# Patient Record
Sex: Male | Born: 1945
Health system: Southern US, Community
[De-identification: ages and names within clinical notes are randomized; demographics above are authoritative.]

## PROBLEM LIST (undated history)

## (undated) DIAGNOSIS — B1910 Unspecified viral hepatitis B without hepatic coma: Secondary | ICD-10-CM

## (undated) DIAGNOSIS — E7800 Pure hypercholesterolemia, unspecified: Secondary | ICD-10-CM

## (undated) DIAGNOSIS — F0390 Unspecified dementia without behavioral disturbance: Secondary | ICD-10-CM

## (undated) DIAGNOSIS — I10 Essential (primary) hypertension: Secondary | ICD-10-CM

## (undated) DIAGNOSIS — I639 Cerebral infarction, unspecified: Secondary | ICD-10-CM

---

## 2010-04-17 ENCOUNTER — Ambulatory Visit: Payer: Self-pay | Admitting: Diagnostic Radiology

## 2010-04-17 ENCOUNTER — Ambulatory Visit (HOSPITAL_BASED_OUTPATIENT_CLINIC_OR_DEPARTMENT_OTHER)
Admission: RE | Admit: 2010-04-17 | Discharge: 2010-04-17 | Payer: Self-pay | Source: Home / Self Care | Admitting: Internal Medicine

## 2016-07-10 DIAGNOSIS — I10 Essential (primary) hypertension: Secondary | ICD-10-CM | POA: Diagnosis not present

## 2016-07-10 DIAGNOSIS — I119 Hypertensive heart disease without heart failure: Secondary | ICD-10-CM | POA: Diagnosis not present

## 2016-07-10 DIAGNOSIS — E785 Hyperlipidemia, unspecified: Secondary | ICD-10-CM | POA: Diagnosis not present

## 2016-07-10 DIAGNOSIS — R7303 Prediabetes: Secondary | ICD-10-CM | POA: Diagnosis not present

## 2016-07-10 DIAGNOSIS — R062 Wheezing: Secondary | ICD-10-CM | POA: Diagnosis not present

## 2016-07-10 DIAGNOSIS — I6789 Other cerebrovascular disease: Secondary | ICD-10-CM | POA: Diagnosis not present

## 2016-07-10 DIAGNOSIS — J449 Chronic obstructive pulmonary disease, unspecified: Secondary | ICD-10-CM | POA: Diagnosis not present

## 2016-07-10 DIAGNOSIS — R739 Hyperglycemia, unspecified: Secondary | ICD-10-CM | POA: Diagnosis not present

## 2016-07-10 DIAGNOSIS — R319 Hematuria, unspecified: Secondary | ICD-10-CM | POA: Diagnosis not present

## 2016-07-12 DIAGNOSIS — E785 Hyperlipidemia, unspecified: Secondary | ICD-10-CM | POA: Diagnosis not present

## 2016-07-12 DIAGNOSIS — R062 Wheezing: Secondary | ICD-10-CM | POA: Diagnosis not present

## 2016-07-12 DIAGNOSIS — R7303 Prediabetes: Secondary | ICD-10-CM | POA: Diagnosis not present

## 2016-07-12 DIAGNOSIS — I10 Essential (primary) hypertension: Secondary | ICD-10-CM | POA: Diagnosis not present

## 2016-07-16 DIAGNOSIS — I6789 Other cerebrovascular disease: Secondary | ICD-10-CM | POA: Diagnosis not present

## 2016-07-16 DIAGNOSIS — Z0000 Encounter for general adult medical examination without abnormal findings: Secondary | ICD-10-CM | POA: Diagnosis not present

## 2016-07-16 DIAGNOSIS — R7303 Prediabetes: Secondary | ICD-10-CM | POA: Diagnosis not present

## 2016-07-16 DIAGNOSIS — I119 Hypertensive heart disease without heart failure: Secondary | ICD-10-CM | POA: Diagnosis not present

## 2016-07-16 DIAGNOSIS — Z136 Encounter for screening for cardiovascular disorders: Secondary | ICD-10-CM | POA: Diagnosis not present

## 2016-07-16 DIAGNOSIS — J449 Chronic obstructive pulmonary disease, unspecified: Secondary | ICD-10-CM | POA: Diagnosis not present

## 2016-07-16 DIAGNOSIS — E785 Hyperlipidemia, unspecified: Secondary | ICD-10-CM | POA: Diagnosis not present

## 2016-07-16 DIAGNOSIS — I10 Essential (primary) hypertension: Secondary | ICD-10-CM | POA: Diagnosis not present

## 2016-07-16 DIAGNOSIS — Z01118 Encounter for examination of ears and hearing with other abnormal findings: Secondary | ICD-10-CM | POA: Diagnosis not present

## 2016-07-16 DIAGNOSIS — Z131 Encounter for screening for diabetes mellitus: Secondary | ICD-10-CM | POA: Diagnosis not present

## 2016-08-10 DIAGNOSIS — J449 Chronic obstructive pulmonary disease, unspecified: Secondary | ICD-10-CM | POA: Diagnosis not present

## 2016-08-10 DIAGNOSIS — R739 Hyperglycemia, unspecified: Secondary | ICD-10-CM | POA: Diagnosis not present

## 2016-08-10 DIAGNOSIS — I119 Hypertensive heart disease without heart failure: Secondary | ICD-10-CM | POA: Diagnosis not present

## 2016-08-10 DIAGNOSIS — I10 Essential (primary) hypertension: Secondary | ICD-10-CM | POA: Diagnosis not present

## 2016-08-10 DIAGNOSIS — R7303 Prediabetes: Secondary | ICD-10-CM | POA: Diagnosis not present

## 2016-08-10 DIAGNOSIS — I6789 Other cerebrovascular disease: Secondary | ICD-10-CM | POA: Diagnosis not present

## 2016-08-10 DIAGNOSIS — R319 Hematuria, unspecified: Secondary | ICD-10-CM | POA: Diagnosis not present

## 2016-08-10 DIAGNOSIS — R062 Wheezing: Secondary | ICD-10-CM | POA: Diagnosis not present

## 2016-08-10 DIAGNOSIS — E785 Hyperlipidemia, unspecified: Secondary | ICD-10-CM | POA: Diagnosis not present

## 2016-08-13 DIAGNOSIS — I10 Essential (primary) hypertension: Secondary | ICD-10-CM | POA: Diagnosis not present

## 2016-08-13 DIAGNOSIS — J449 Chronic obstructive pulmonary disease, unspecified: Secondary | ICD-10-CM | POA: Diagnosis not present

## 2016-08-13 DIAGNOSIS — R69 Illness, unspecified: Secondary | ICD-10-CM | POA: Diagnosis not present

## 2016-08-13 DIAGNOSIS — I6789 Other cerebrovascular disease: Secondary | ICD-10-CM | POA: Diagnosis not present

## 2016-08-13 DIAGNOSIS — I119 Hypertensive heart disease without heart failure: Secondary | ICD-10-CM | POA: Diagnosis not present

## 2016-08-13 DIAGNOSIS — Z87891 Personal history of nicotine dependence: Secondary | ICD-10-CM | POA: Diagnosis not present

## 2016-08-13 DIAGNOSIS — R7303 Prediabetes: Secondary | ICD-10-CM | POA: Diagnosis not present

## 2016-08-13 DIAGNOSIS — E785 Hyperlipidemia, unspecified: Secondary | ICD-10-CM | POA: Diagnosis not present

## 2016-12-17 DIAGNOSIS — I119 Hypertensive heart disease without heart failure: Secondary | ICD-10-CM | POA: Diagnosis not present

## 2016-12-17 DIAGNOSIS — E785 Hyperlipidemia, unspecified: Secondary | ICD-10-CM | POA: Diagnosis not present

## 2016-12-17 DIAGNOSIS — Z87891 Personal history of nicotine dependence: Secondary | ICD-10-CM | POA: Diagnosis not present

## 2016-12-17 DIAGNOSIS — I10 Essential (primary) hypertension: Secondary | ICD-10-CM | POA: Diagnosis not present

## 2016-12-24 DIAGNOSIS — J449 Chronic obstructive pulmonary disease, unspecified: Secondary | ICD-10-CM | POA: Diagnosis not present

## 2016-12-24 DIAGNOSIS — I10 Essential (primary) hypertension: Secondary | ICD-10-CM | POA: Diagnosis not present

## 2016-12-24 DIAGNOSIS — E785 Hyperlipidemia, unspecified: Secondary | ICD-10-CM | POA: Diagnosis not present

## 2016-12-24 DIAGNOSIS — I6789 Other cerebrovascular disease: Secondary | ICD-10-CM | POA: Diagnosis not present

## 2016-12-24 DIAGNOSIS — R69 Illness, unspecified: Secondary | ICD-10-CM | POA: Diagnosis not present

## 2016-12-24 DIAGNOSIS — I119 Hypertensive heart disease without heart failure: Secondary | ICD-10-CM | POA: Diagnosis not present

## 2016-12-24 DIAGNOSIS — R7303 Prediabetes: Secondary | ICD-10-CM | POA: Diagnosis not present

## 2016-12-24 DIAGNOSIS — Z0000 Encounter for general adult medical examination without abnormal findings: Secondary | ICD-10-CM | POA: Diagnosis not present

## 2016-12-24 DIAGNOSIS — Z87891 Personal history of nicotine dependence: Secondary | ICD-10-CM | POA: Diagnosis not present

## 2017-08-30 DIAGNOSIS — R69 Illness, unspecified: Secondary | ICD-10-CM | POA: Diagnosis not present

## 2017-08-30 DIAGNOSIS — Z136 Encounter for screening for cardiovascular disorders: Secondary | ICD-10-CM | POA: Diagnosis not present

## 2017-08-30 DIAGNOSIS — E785 Hyperlipidemia, unspecified: Secondary | ICD-10-CM | POA: Diagnosis not present

## 2017-08-30 DIAGNOSIS — R7303 Prediabetes: Secondary | ICD-10-CM | POA: Diagnosis not present

## 2017-08-30 DIAGNOSIS — I10 Essential (primary) hypertension: Secondary | ICD-10-CM | POA: Diagnosis not present

## 2017-08-30 DIAGNOSIS — I119 Hypertensive heart disease without heart failure: Secondary | ICD-10-CM | POA: Diagnosis not present

## 2017-08-30 DIAGNOSIS — Z131 Encounter for screening for diabetes mellitus: Secondary | ICD-10-CM | POA: Diagnosis not present

## 2017-08-30 DIAGNOSIS — Z0000 Encounter for general adult medical examination without abnormal findings: Secondary | ICD-10-CM | POA: Diagnosis not present

## 2017-08-30 DIAGNOSIS — I6789 Other cerebrovascular disease: Secondary | ICD-10-CM | POA: Diagnosis not present

## 2017-08-30 DIAGNOSIS — J449 Chronic obstructive pulmonary disease, unspecified: Secondary | ICD-10-CM | POA: Diagnosis not present

## 2017-08-30 DIAGNOSIS — Z01118 Encounter for examination of ears and hearing with other abnormal findings: Secondary | ICD-10-CM | POA: Diagnosis not present

## 2017-08-30 DIAGNOSIS — Z87891 Personal history of nicotine dependence: Secondary | ICD-10-CM | POA: Diagnosis not present

## 2017-09-16 DIAGNOSIS — I10 Essential (primary) hypertension: Secondary | ICD-10-CM | POA: Diagnosis not present

## 2017-09-16 DIAGNOSIS — I6789 Other cerebrovascular disease: Secondary | ICD-10-CM | POA: Diagnosis not present

## 2017-09-16 DIAGNOSIS — E785 Hyperlipidemia, unspecified: Secondary | ICD-10-CM | POA: Diagnosis not present

## 2017-09-16 DIAGNOSIS — Z87891 Personal history of nicotine dependence: Secondary | ICD-10-CM | POA: Diagnosis not present

## 2017-09-16 DIAGNOSIS — I119 Hypertensive heart disease without heart failure: Secondary | ICD-10-CM | POA: Diagnosis not present

## 2017-09-16 DIAGNOSIS — R7303 Prediabetes: Secondary | ICD-10-CM | POA: Diagnosis not present

## 2017-09-16 DIAGNOSIS — J449 Chronic obstructive pulmonary disease, unspecified: Secondary | ICD-10-CM | POA: Diagnosis not present

## 2017-10-18 DIAGNOSIS — B181 Chronic viral hepatitis B without delta-agent: Secondary | ICD-10-CM | POA: Diagnosis not present

## 2017-10-22 DIAGNOSIS — N281 Cyst of kidney, acquired: Secondary | ICD-10-CM | POA: Diagnosis not present

## 2017-10-22 DIAGNOSIS — B181 Chronic viral hepatitis B without delta-agent: Secondary | ICD-10-CM | POA: Diagnosis not present

## 2017-11-06 DIAGNOSIS — M545 Low back pain: Secondary | ICD-10-CM | POA: Diagnosis not present

## 2017-11-18 DIAGNOSIS — J449 Chronic obstructive pulmonary disease, unspecified: Secondary | ICD-10-CM | POA: Diagnosis not present

## 2017-11-18 DIAGNOSIS — M545 Low back pain: Secondary | ICD-10-CM | POA: Diagnosis not present

## 2017-11-18 DIAGNOSIS — I6789 Other cerebrovascular disease: Secondary | ICD-10-CM | POA: Diagnosis not present

## 2017-11-18 DIAGNOSIS — R7303 Prediabetes: Secondary | ICD-10-CM | POA: Diagnosis not present

## 2017-11-18 DIAGNOSIS — I119 Hypertensive heart disease without heart failure: Secondary | ICD-10-CM | POA: Diagnosis not present

## 2017-11-18 DIAGNOSIS — E785 Hyperlipidemia, unspecified: Secondary | ICD-10-CM | POA: Diagnosis not present

## 2017-11-18 DIAGNOSIS — I10 Essential (primary) hypertension: Secondary | ICD-10-CM | POA: Diagnosis not present

## 2017-11-18 DIAGNOSIS — Z87891 Personal history of nicotine dependence: Secondary | ICD-10-CM | POA: Diagnosis not present

## 2017-12-05 DIAGNOSIS — B181 Chronic viral hepatitis B without delta-agent: Secondary | ICD-10-CM | POA: Diagnosis not present

## 2017-12-09 DIAGNOSIS — I6789 Other cerebrovascular disease: Secondary | ICD-10-CM | POA: Diagnosis not present

## 2017-12-09 DIAGNOSIS — M545 Low back pain: Secondary | ICD-10-CM | POA: Diagnosis not present

## 2017-12-09 DIAGNOSIS — I10 Essential (primary) hypertension: Secondary | ICD-10-CM | POA: Diagnosis not present

## 2017-12-09 DIAGNOSIS — R7303 Prediabetes: Secondary | ICD-10-CM | POA: Diagnosis not present

## 2017-12-09 DIAGNOSIS — Z87891 Personal history of nicotine dependence: Secondary | ICD-10-CM | POA: Diagnosis not present

## 2017-12-09 DIAGNOSIS — I119 Hypertensive heart disease without heart failure: Secondary | ICD-10-CM | POA: Diagnosis not present

## 2017-12-09 DIAGNOSIS — E785 Hyperlipidemia, unspecified: Secondary | ICD-10-CM | POA: Diagnosis not present

## 2017-12-09 DIAGNOSIS — J449 Chronic obstructive pulmonary disease, unspecified: Secondary | ICD-10-CM | POA: Diagnosis not present

## 2017-12-23 DIAGNOSIS — I10 Essential (primary) hypertension: Secondary | ICD-10-CM | POA: Diagnosis not present

## 2017-12-23 DIAGNOSIS — E785 Hyperlipidemia, unspecified: Secondary | ICD-10-CM | POA: Diagnosis not present

## 2017-12-23 DIAGNOSIS — Z0000 Encounter for general adult medical examination without abnormal findings: Secondary | ICD-10-CM | POA: Diagnosis not present

## 2017-12-23 DIAGNOSIS — R7303 Prediabetes: Secondary | ICD-10-CM | POA: Diagnosis not present

## 2017-12-23 DIAGNOSIS — I6789 Other cerebrovascular disease: Secondary | ICD-10-CM | POA: Diagnosis not present

## 2017-12-23 DIAGNOSIS — I119 Hypertensive heart disease without heart failure: Secondary | ICD-10-CM | POA: Diagnosis not present

## 2017-12-23 DIAGNOSIS — Z131 Encounter for screening for diabetes mellitus: Secondary | ICD-10-CM | POA: Diagnosis not present

## 2017-12-23 DIAGNOSIS — Z87891 Personal history of nicotine dependence: Secondary | ICD-10-CM | POA: Diagnosis not present

## 2017-12-23 DIAGNOSIS — J449 Chronic obstructive pulmonary disease, unspecified: Secondary | ICD-10-CM | POA: Diagnosis not present

## 2017-12-23 DIAGNOSIS — M545 Low back pain: Secondary | ICD-10-CM | POA: Diagnosis not present

## 2018-03-26 DIAGNOSIS — R69 Illness, unspecified: Secondary | ICD-10-CM | POA: Diagnosis not present

## 2018-04-09 DIAGNOSIS — H2513 Age-related nuclear cataract, bilateral: Secondary | ICD-10-CM | POA: Diagnosis not present

## 2018-04-09 DIAGNOSIS — H53462 Homonymous bilateral field defects, left side: Secondary | ICD-10-CM | POA: Diagnosis not present

## 2018-04-09 DIAGNOSIS — H5203 Hypermetropia, bilateral: Secondary | ICD-10-CM | POA: Diagnosis not present

## 2018-04-09 DIAGNOSIS — Z8673 Personal history of transient ischemic attack (TIA), and cerebral infarction without residual deficits: Secondary | ICD-10-CM | POA: Diagnosis not present

## 2018-04-09 DIAGNOSIS — B181 Chronic viral hepatitis B without delta-agent: Secondary | ICD-10-CM | POA: Diagnosis not present

## 2018-04-09 DIAGNOSIS — H35363 Drusen (degenerative) of macula, bilateral: Secondary | ICD-10-CM | POA: Diagnosis not present

## 2018-04-09 DIAGNOSIS — H25013 Cortical age-related cataract, bilateral: Secondary | ICD-10-CM | POA: Diagnosis not present

## 2018-05-19 DIAGNOSIS — B181 Chronic viral hepatitis B without delta-agent: Secondary | ICD-10-CM | POA: Diagnosis not present

## 2018-05-26 DIAGNOSIS — N281 Cyst of kidney, acquired: Secondary | ICD-10-CM | POA: Diagnosis not present

## 2018-05-26 DIAGNOSIS — N261 Atrophy of kidney (terminal): Secondary | ICD-10-CM | POA: Diagnosis not present

## 2018-06-17 DIAGNOSIS — N189 Chronic kidney disease, unspecified: Secondary | ICD-10-CM | POA: Diagnosis not present

## 2018-06-17 DIAGNOSIS — N281 Cyst of kidney, acquired: Secondary | ICD-10-CM | POA: Diagnosis not present

## 2018-06-17 DIAGNOSIS — R93421 Abnormal radiologic findings on diagnostic imaging of right kidney: Secondary | ICD-10-CM | POA: Diagnosis not present

## 2018-06-17 DIAGNOSIS — B181 Chronic viral hepatitis B without delta-agent: Secondary | ICD-10-CM | POA: Diagnosis not present

## 2018-06-17 DIAGNOSIS — R93422 Abnormal radiologic findings on diagnostic imaging of left kidney: Secondary | ICD-10-CM | POA: Diagnosis not present

## 2018-07-26 ENCOUNTER — Encounter (HOSPITAL_BASED_OUTPATIENT_CLINIC_OR_DEPARTMENT_OTHER): Payer: Self-pay | Admitting: Emergency Medicine

## 2018-07-26 ENCOUNTER — Emergency Department (HOSPITAL_BASED_OUTPATIENT_CLINIC_OR_DEPARTMENT_OTHER): Payer: Medicare HMO

## 2018-07-26 ENCOUNTER — Other Ambulatory Visit: Payer: Self-pay

## 2018-07-26 ENCOUNTER — Emergency Department (HOSPITAL_BASED_OUTPATIENT_CLINIC_OR_DEPARTMENT_OTHER)
Admission: EM | Admit: 2018-07-26 | Discharge: 2018-07-26 | Disposition: A | Discharge status: Home / Self Care | Payer: Medicare HMO | Attending: Emergency Medicine | Admitting: Emergency Medicine

## 2018-07-26 DIAGNOSIS — R5383 Other fatigue: Secondary | ICD-10-CM | POA: Diagnosis not present

## 2018-07-26 DIAGNOSIS — I10 Essential (primary) hypertension: Secondary | ICD-10-CM | POA: Diagnosis not present

## 2018-07-26 DIAGNOSIS — F0390 Unspecified dementia without behavioral disturbance: Secondary | ICD-10-CM | POA: Insufficient documentation

## 2018-07-26 DIAGNOSIS — R69 Illness, unspecified: Secondary | ICD-10-CM | POA: Diagnosis not present

## 2018-07-26 DIAGNOSIS — R531 Weakness: Secondary | ICD-10-CM | POA: Insufficient documentation

## 2018-07-26 DIAGNOSIS — Z7982 Long term (current) use of aspirin: Secondary | ICD-10-CM | POA: Insufficient documentation

## 2018-07-26 DIAGNOSIS — R51 Headache: Secondary | ICD-10-CM | POA: Diagnosis not present

## 2018-07-26 DIAGNOSIS — R42 Dizziness and giddiness: Secondary | ICD-10-CM | POA: Insufficient documentation

## 2018-07-26 DIAGNOSIS — R519 Headache, unspecified: Secondary | ICD-10-CM

## 2018-07-26 DIAGNOSIS — Z79899 Other long term (current) drug therapy: Secondary | ICD-10-CM | POA: Insufficient documentation

## 2018-07-26 DIAGNOSIS — S0003XA Contusion of scalp, initial encounter: Secondary | ICD-10-CM | POA: Diagnosis not present

## 2018-07-26 HISTORY — DX: Pure hypercholesterolemia, unspecified: E78.00

## 2018-07-26 HISTORY — DX: Essential (primary) hypertension: I10

## 2018-07-26 HISTORY — DX: Cerebral infarction, unspecified: I63.9

## 2018-07-26 HISTORY — DX: Unspecified viral hepatitis B without hepatic coma: B19.10

## 2018-07-26 LAB — CBC WITH DIFFERENTIAL/PLATELET
ABS IMMATURE GRANULOCYTES: 0.03 10*3/uL (ref 0.00–0.07)
Basophils Absolute: 0 10*3/uL (ref 0.0–0.1)
Basophils Relative: 1 %
Eosinophils Absolute: 0.2 10*3/uL (ref 0.0–0.5)
Eosinophils Relative: 3 %
HEMATOCRIT: 46.6 % (ref 39.0–52.0)
HEMOGLOBIN: 15.7 g/dL (ref 13.0–17.0)
Immature Granulocytes: 1 %
LYMPHS ABS: 1.3 10*3/uL (ref 0.7–4.0)
Lymphocytes Relative: 20 %
MCH: 29.7 pg (ref 26.0–34.0)
MCHC: 33.7 g/dL (ref 30.0–36.0)
MCV: 88.1 fL (ref 80.0–100.0)
MONO ABS: 0.5 10*3/uL (ref 0.1–1.0)
MONOS PCT: 8 %
NEUTROS ABS: 4.5 10*3/uL (ref 1.7–7.7)
Neutrophils Relative %: 67 %
Platelets: 208 10*3/uL (ref 150–400)
RBC: 5.29 MIL/uL (ref 4.22–5.81)
RDW: 12.3 % (ref 11.5–15.5)
WBC: 6.5 10*3/uL (ref 4.0–10.5)
nRBC: 0 % (ref 0.0–0.2)

## 2018-07-26 LAB — URINALYSIS, MICROSCOPIC (REFLEX)

## 2018-07-26 LAB — ETHANOL

## 2018-07-26 LAB — COMPREHENSIVE METABOLIC PANEL
ALK PHOS: 66 U/L (ref 38–126)
ALT: 25 U/L (ref 0–44)
AST: 26 U/L (ref 15–41)
Albumin: 4.3 g/dL (ref 3.5–5.0)
Anion gap: 9 (ref 5–15)
BUN: 13 mg/dL (ref 8–23)
CO2: 27 mmol/L (ref 22–32)
Calcium: 9.1 mg/dL (ref 8.9–10.3)
Chloride: 101 mmol/L (ref 98–111)
Creatinine, Ser: 0.92 mg/dL (ref 0.61–1.24)
GFR calc Af Amer: >60 mL/min (ref >60)
Glucose, Bld: 113 mg/dL — ABNORMAL HIGH (ref 70–99)
Potassium: 3.8 mmol/L (ref 3.5–5.1)
Sodium: 137 mmol/L (ref 135–145)
Total Bilirubin: 0.9 mg/dL (ref 0.3–1.2)
Total Protein: 7.8 g/dL (ref 6.5–8.1)

## 2018-07-26 LAB — URINALYSIS, ROUTINE W REFLEX MICROSCOPIC
Bilirubin Urine: NEGATIVE
GLUCOSE, UA: NEGATIVE mg/dL
Ketones, ur: NEGATIVE mg/dL
Leukocytes, UA: NEGATIVE
Nitrite: NEGATIVE
PH: 7.5 (ref 5.0–8.0)
Protein, ur: NEGATIVE mg/dL
Specific Gravity, Urine: 1.01 (ref 1.005–1.030)

## 2018-07-26 LAB — RAPID URINE DRUG SCREEN, HOSP PERFORMED
Amphetamines: NOT DETECTED
BENZODIAZEPINES: NOT DETECTED
Barbiturates: NOT DETECTED
COCAINE: NOT DETECTED
OPIATES: NOT DETECTED
TETRAHYDROCANNABINOL: NOT DETECTED

## 2018-07-26 LAB — PROTIME-INR
INR: 0.9
Prothrombin Time: 12.1 seconds (ref 11.4–15.2)

## 2018-07-26 LAB — APTT: aPTT: 26 seconds (ref 24–36)

## 2018-07-26 LAB — CBG MONITORING, ED: Glucose-Capillary: 117 mg/dL — ABNORMAL HIGH (ref 70–99)

## 2018-07-26 LAB — TROPONIN I: Troponin I: <0.03 ng/mL (ref <0.03)

## 2018-07-26 MED ORDER — ACETAMINOPHEN 325 MG PO TABS
650.0000 mg | ORAL_TABLET | Freq: Once | ORAL | Status: AC
  Administered 2018-07-26: 650 mg via ORAL
  Filled 2018-07-26: qty 2

## 2018-07-26 MED ORDER — SODIUM CHLORIDE 0.9 % IV BOLUS
500.0000 mL | Freq: Once | INTRAVENOUS | Status: AC
  Administered 2018-07-26: 500 mL via INTRAVENOUS

## 2018-07-26 NOTE — Discharge Instructions (Addendum)
If you develop continued, recurrent, or worsening headache, fever, neck stiffness, vomiting, blurry or double vision, weakness or numbness in your arms or legs, trouble speaking, or any other new/concerning symptoms then return to the ER for evaluation.  

## 2018-07-26 NOTE — ED Provider Notes (Signed)
[facility] EMERGENCY DEPARTMENT Provider Note   CSN: [id number] Arrival date & time: 07/26/18  9233     History   Chief Complaint Chief Complaint  Patient presents with  . Dizziness  . Headache    HPI Collier Kirchberg is a 73 y.o. male.  HPI  72 year old male presents with headache, dizziness, and fatigue.  History is taken through the daughter who interprets.  The patient developed a headache sometime around 7 PM last night.  The headache was probably around a 7 at that time and is now about a 5.  It has been continuous since.  Feels like there is a constant pain at the top of his scalp and he also has generalized weakness since.  He feels dizzy like some things are spinning.  No blurry vision or double vision.  No neck pain or stiffness, chest pain, shortness of breath, cough, fever, vomiting.  No unilateral weakness.  Has not taken anything for the headache. Family states he has a history of memory issues/dementia but is at his baseline/not confused currently. No difficulty with ambulating at home.  Past Medical History:  Diagnosis Date  . Hepatitis B   . High cholesterol   . Hypertension   . Stroke [facility])     There are no active problems to display for this patient.   History reviewed. No pertinent surgical history.      Home Medications    Prior to Admission medications   Medication Sig Start Date End Date Taking? Authorizing Provider  aspirin EC 81 MG tablet Take 81 mg by mouth daily.   Yes [provider]  donepezil (ARICEPT) 5 MG tablet Take 5 mg by mouth at bedtime.   Yes [provider]  olmesartan-hydrochlorothiazide (BENICAR HCT) 20-12.5 MG tablet Take 1 tablet by mouth daily.   Yes [provider]  simvastatin (ZOCOR) 20 MG tablet Take 20 mg by mouth daily.   Yes [provider]    Family History No family history on file.  Social History Social History   Tobacco Use  . Smoking status: Never Smoker  .  Smokeless tobacco: Never Used  Substance Use Topics  . Alcohol use: Not Currently  . Drug use: Not on file     Allergies   Patient has no known allergies.   Review of Systems Review of Systems  Constitutional: Negative for fever.  Eyes: Negative for visual disturbance.  Respiratory: Negative for shortness of breath.   Cardiovascular: Negative for chest pain.  Gastrointestinal: Negative for vomiting.  Musculoskeletal: Negative for neck pain.  Neurological: Positive for dizziness, weakness (generalized) and headaches. Negative for speech difficulty.  All other systems reviewed and are negative.    Physical Exam Updated Vital Signs BP (!) 141/94   Pulse 70   Temp 98.1 F (36.7 C) (Oral)   Resp 18   Ht 5\' 3"  (1.6 m)   Wt 58.1 kg   SpO2 100%   BMI 22.67 kg/m   Physical Exam Vitals signs and nursing note reviewed.  Constitutional:      Appearance: He is well-developed.  HENT:     Head: Normocephalic and atraumatic.     Comments: No scalp tenderness    Right Ear: External ear normal.     Left Ear: External ear normal.     Nose: Nose normal.  Eyes:     General:        Right eye: No discharge.        Left eye:  No discharge.     Extraocular Movements: Extraocular movements intact.     Right eye: No nystagmus.     Left eye: No nystagmus.     Pupils: Pupils are equal, round, and reactive to light.  Neck:     Musculoskeletal: Normal range of motion and neck supple. No neck rigidity.  Cardiovascular:     Rate and Rhythm: Normal rate and regular rhythm.     Heart sounds: Normal heart sounds.  Pulmonary:     Effort: Pulmonary effort is normal.     Breath sounds: Normal breath sounds.  Abdominal:     Palpations: Abdomen is soft.     Tenderness: There is no abdominal tenderness.  Skin:    General: Skin is warm and dry.  Neurological:     Mental Status: He is alert.     Comments: CN 3-12 grossly intact. 5/5 strength in all 4 extremities. Grossly normal sensation.  Normal finger to nose. Normal gait.  Psychiatric:        Mood and Affect: Mood is not anxious.      ED Treatments / Results  Labs (all labs ordered are listed, but only abnormal results are displayed) Labs Reviewed  URINALYSIS, ROUTINE W REFLEX MICROSCOPIC - Abnormal; Notable for the following components:      Result Value   Hgb urine dipstick TRACE (*)    All other components within normal limits  COMPREHENSIVE METABOLIC PANEL - Abnormal; Notable for the following components:   Glucose, Bld 113 (*)    All other components within normal limits  URINALYSIS, MICROSCOPIC (REFLEX) - Abnormal; Notable for the following components:   Bacteria, UA RARE (*)    All other components within normal limits  CBG MONITORING, ED - Abnormal; Notable for the following components:   Glucose-Capillary 117 (*)    All other components within normal limits  ETHANOL  TROPONIN I  RAPID URINE DRUG SCREEN, HOSP PERFORMED  CBC WITH DIFFERENTIAL/PLATELET  PROTIME-INR  APTT    EKG EKG Interpretation  Date/Time:  Saturday July 26 2018 08:48:58 EST Ventricular Rate:  80 PR Interval:    QRS Duration: 97 QT Interval:  393 QTC Calculation: 454 R Axis:   -38 Text Interpretation:  Normal sinus rhythm Prolonged PR interval Left axis deviation Anterior infarct, old Artifact No old tracing to compare Confirmed by Pricilla Loveless [id number]) on 07/26/2018 8:52:25 AM   Radiology Ct Head Wo Contrast  Result Date: 07/26/2018 CLINICAL DATA:  Dizziness.  Ataxia. EXAM: CT HEAD WITHOUT CONTRAST TECHNIQUE: Contiguous axial images were obtained from the base of the skull through the vertex without intravenous contrast. COMPARISON:  January 04, 2015 FINDINGS: Brain: There is moderate diffuse atrophy. There is no intracranial mass, hemorrhage, extra-axial fluid collection, or midline shift. There is evidence of a prior infarct in the medial right occipital lobe. There is evidence of a prior infarct in the posterior left  parietal lobe. There is patchy small vessel disease in the centra semiovale bilaterally. There is evidence of a prior infarct involving a portion of the head of the caudate nucleus on the left. There is small vessel disease in the anterior limb of each internal and external capsule. No acute infarct is evident. Vascular: No hyperdense vessel. There is calcification in each carotid siphon region. Skull: The bony calvarium appears intact. There is a small left parietal scalp hematoma. Sinuses/Orbits: There is mucosal thickening in several ethmoid air cells. There is slight mucosal thickening in the medial right maxillary antrum. Other paranasal  sinuses which are visualized are clear. Orbits appear symmetric bilaterally. Other: Mastoid air cells are clear. IMPRESSION: Stable atrophy with prior infarcts as noted. No acute infarct evident. No mass or hemorrhage. There are foci of arterial vascular calcification. There is a small left parietal scalp hematoma without underlying fracture. There are areas of paranasal sinus disease. Electronically Signed   By: Bretta BangWilliam  Woodruff III M.D.   On: 07/26/2018 09:52   Mr Brain Wo Contrast  Result Date: 07/26/2018 CLINICAL DATA:  Dizziness and weakness with lethargy EXAM: MRI HEAD WITHOUT CONTRAST TECHNIQUE: Multiplanar, multiecho pulse sequences of the brain and surrounding structures were obtained without intravenous contrast. COMPARISON:  06/25/2009 FINDINGS: Brain: Advanced brain atrophy with asymmetric medial temporal volume loss on the right. Moderate remote right occipital and left parietal infarcts. Small vessel infarcts in the thin bilateral corona radiata. No acute infarct, acute hemorrhage, obstructive hydrocephalus, or collection. Vascular: Major flow voids are preserved Skull and upper cervical spine: Negative for marrow lesion. Chronic small appearance of the dens which may also be eroded. No associated pannus. Sinuses/Orbits: Negative Other: Intermittently motion  degraded. IMPRESSION: 1. No acute finding. 2. Advanced atrophy. 3. Moderate remote right occipital and left parietal infarcts. Small vessel ischemia in the deep cerebral white matter. Electronically Signed   By: Marnee SpringJonathon  Watts M.D.   On: 07/26/2018 12:24    Procedures Procedures (including critical care time) Angiocath insertion Performed by: Audree CamelScott T Esco Joslyn  Consent: Verbal consent obtained. Risks and benefits: risks, benefits and alternatives were discussed Time out: Immediately prior to procedure a "time out" was called to verify the correct patient, procedure, equipment, support staff and site/side marked as required.  Preparation: Patient was prepped and draped in the usual sterile fashion.  Vein Location: right basilic  Ultrasound Guided  Gauge: 20  Normal blood return and flush without difficulty Patient tolerance: Patient tolerated the procedure well with no immediate complications.     Medications Ordered in ED Medications  sodium chloride 0.9 % bolus 500 mL ( Intravenous Stopped 07/26/18 1117)  acetaminophen (TYLENOL) tablet 650 mg (650 mg Oral Given 07/26/18 [id number])     Initial Impression / Assessment and Plan / ED Course  I have reviewed the triage vital signs and the nursing notes.  Pertinent labs & imaging results that were available during my care of the patient were reviewed by me and considered in my medical decision making (see chart for details).     Patient presents with moderate headache and room spinning.  CT and lab work is overall reassuring.  Given age and prior strokes, MRI obtained to help rule out acute cerebellar infarct.  This is negative for acute pathology.  He is ambulating without difficulty and reports his symptoms have significantly improved and are essentially gone.  I doubt subarachnoid hemorrhage in this presentation given no severe/thunderclap headache and no other associated signs/symptoms such as vomiting or neck pain/stiffness.  Doubt  acute CNS emergency otherwise such as encephalitis or meningitis.  Will discharge home to follow-up with PCP.  Discussed return precautions.  Final Clinical Impressions(s) / ED Diagnoses   Final diagnoses:  Acute nonintractable headache, unspecified headache type  Vertigo    ED Discharge Orders    None       Pricilla LovelessGoldston, Justis Closser, MD 07/26/18 1438

## 2018-07-26 NOTE — ED Notes (Signed)
ED Provider at bedside. 

## 2018-07-26 NOTE — ED Notes (Signed)
Patient transported to MRI 

## 2018-07-26 NOTE — ED Notes (Signed)
IV attempted by RT without success.

## 2018-07-26 NOTE — ED Notes (Addendum)
Pt transported to CT ?

## 2018-07-26 NOTE — ED Notes (Signed)
IV attempted x2 without success.

## 2018-07-26 NOTE — ED Triage Notes (Signed)
Pt speaks vietnamese. Pt daughter is at bedside and speaks english. Pt c/o headache to the top of his head, dizziness and generalized weakness since 7pm yesterday. Denies weakness to arms/legs, speech clear.

## 2018-09-01 DIAGNOSIS — Z131 Encounter for screening for diabetes mellitus: Secondary | ICD-10-CM | POA: Diagnosis not present

## 2018-09-01 DIAGNOSIS — Z0001 Encounter for general adult medical examination with abnormal findings: Secondary | ICD-10-CM | POA: Diagnosis not present

## 2018-09-01 DIAGNOSIS — J449 Chronic obstructive pulmonary disease, unspecified: Secondary | ICD-10-CM | POA: Diagnosis not present

## 2018-09-01 DIAGNOSIS — I6789 Other cerebrovascular disease: Secondary | ICD-10-CM | POA: Diagnosis not present

## 2018-09-01 DIAGNOSIS — R7303 Prediabetes: Secondary | ICD-10-CM | POA: Diagnosis not present

## 2018-09-01 DIAGNOSIS — B488 Other specified mycoses: Secondary | ICD-10-CM | POA: Diagnosis not present

## 2018-09-01 DIAGNOSIS — Z87891 Personal history of nicotine dependence: Secondary | ICD-10-CM | POA: Diagnosis not present

## 2018-09-01 DIAGNOSIS — I119 Hypertensive heart disease without heart failure: Secondary | ICD-10-CM | POA: Diagnosis not present

## 2018-09-01 DIAGNOSIS — Z136 Encounter for screening for cardiovascular disorders: Secondary | ICD-10-CM | POA: Diagnosis not present

## 2018-09-01 DIAGNOSIS — I10 Essential (primary) hypertension: Secondary | ICD-10-CM | POA: Diagnosis not present

## 2018-09-01 DIAGNOSIS — Z01118 Encounter for examination of ears and hearing with other abnormal findings: Secondary | ICD-10-CM | POA: Diagnosis not present

## 2018-09-23 DIAGNOSIS — I119 Hypertensive heart disease without heart failure: Secondary | ICD-10-CM | POA: Diagnosis not present

## 2018-09-23 DIAGNOSIS — I6789 Other cerebrovascular disease: Secondary | ICD-10-CM | POA: Diagnosis not present

## 2018-09-23 DIAGNOSIS — R7303 Prediabetes: Secondary | ICD-10-CM | POA: Diagnosis not present

## 2018-09-23 DIAGNOSIS — E782 Mixed hyperlipidemia: Secondary | ICD-10-CM | POA: Diagnosis not present

## 2018-09-23 DIAGNOSIS — Z87891 Personal history of nicotine dependence: Secondary | ICD-10-CM | POA: Diagnosis not present

## 2018-09-23 DIAGNOSIS — J449 Chronic obstructive pulmonary disease, unspecified: Secondary | ICD-10-CM | POA: Diagnosis not present

## 2018-09-23 DIAGNOSIS — I10 Essential (primary) hypertension: Secondary | ICD-10-CM | POA: Diagnosis not present

## 2018-11-24 DIAGNOSIS — B181 Chronic viral hepatitis B without delta-agent: Secondary | ICD-10-CM | POA: Diagnosis not present

## 2018-12-26 DIAGNOSIS — R7303 Prediabetes: Secondary | ICD-10-CM | POA: Diagnosis not present

## 2018-12-26 DIAGNOSIS — J449 Chronic obstructive pulmonary disease, unspecified: Secondary | ICD-10-CM | POA: Diagnosis not present

## 2018-12-26 DIAGNOSIS — E782 Mixed hyperlipidemia: Secondary | ICD-10-CM | POA: Diagnosis not present

## 2018-12-26 DIAGNOSIS — I6789 Other cerebrovascular disease: Secondary | ICD-10-CM | POA: Diagnosis not present

## 2018-12-26 DIAGNOSIS — I119 Hypertensive heart disease without heart failure: Secondary | ICD-10-CM | POA: Diagnosis not present

## 2018-12-26 DIAGNOSIS — Z0001 Encounter for general adult medical examination with abnormal findings: Secondary | ICD-10-CM | POA: Diagnosis not present

## 2018-12-26 DIAGNOSIS — I10 Essential (primary) hypertension: Secondary | ICD-10-CM | POA: Diagnosis not present

## 2018-12-26 DIAGNOSIS — Z87891 Personal history of nicotine dependence: Secondary | ICD-10-CM | POA: Diagnosis not present

## 2019-01-07 DIAGNOSIS — B181 Chronic viral hepatitis B without delta-agent: Secondary | ICD-10-CM | POA: Diagnosis not present

## 2019-04-13 DIAGNOSIS — R69 Illness, unspecified: Secondary | ICD-10-CM | POA: Diagnosis not present

## 2019-06-01 DIAGNOSIS — R69 Illness, unspecified: Secondary | ICD-10-CM | POA: Diagnosis not present

## 2019-06-15 DIAGNOSIS — R69 Illness, unspecified: Secondary | ICD-10-CM | POA: Diagnosis not present

## 2019-06-24 DIAGNOSIS — K7689 Other specified diseases of liver: Secondary | ICD-10-CM | POA: Diagnosis not present

## 2019-06-24 DIAGNOSIS — R69 Illness, unspecified: Secondary | ICD-10-CM | POA: Diagnosis not present

## 2019-07-13 DIAGNOSIS — Z0001 Encounter for general adult medical examination with abnormal findings: Secondary | ICD-10-CM | POA: Diagnosis not present

## 2019-07-13 DIAGNOSIS — Z01118 Encounter for examination of ears and hearing with other abnormal findings: Secondary | ICD-10-CM | POA: Diagnosis not present

## 2019-07-13 DIAGNOSIS — I119 Hypertensive heart disease without heart failure: Secondary | ICD-10-CM | POA: Diagnosis not present

## 2019-07-13 DIAGNOSIS — Z1329 Encounter for screening for other suspected endocrine disorder: Secondary | ICD-10-CM | POA: Diagnosis not present

## 2019-07-13 DIAGNOSIS — Z131 Encounter for screening for diabetes mellitus: Secondary | ICD-10-CM | POA: Diagnosis not present

## 2019-07-13 DIAGNOSIS — E782 Mixed hyperlipidemia: Secondary | ICD-10-CM | POA: Diagnosis not present

## 2019-07-13 DIAGNOSIS — J449 Chronic obstructive pulmonary disease, unspecified: Secondary | ICD-10-CM | POA: Diagnosis not present

## 2019-07-13 DIAGNOSIS — Z789 Other specified health status: Secondary | ICD-10-CM | POA: Diagnosis not present

## 2019-07-13 DIAGNOSIS — I10 Essential (primary) hypertension: Secondary | ICD-10-CM | POA: Diagnosis not present

## 2019-07-13 DIAGNOSIS — R7303 Prediabetes: Secondary | ICD-10-CM | POA: Diagnosis not present

## 2019-07-13 DIAGNOSIS — I6789 Other cerebrovascular disease: Secondary | ICD-10-CM | POA: Diagnosis not present

## 2019-08-21 DIAGNOSIS — Z008 Encounter for other general examination: Secondary | ICD-10-CM | POA: Diagnosis not present

## 2019-08-21 DIAGNOSIS — E785 Hyperlipidemia, unspecified: Secondary | ICD-10-CM | POA: Diagnosis not present

## 2019-08-21 DIAGNOSIS — N529 Male erectile dysfunction, unspecified: Secondary | ICD-10-CM | POA: Diagnosis not present

## 2019-08-21 DIAGNOSIS — Z87891 Personal history of nicotine dependence: Secondary | ICD-10-CM | POA: Diagnosis not present

## 2019-08-21 DIAGNOSIS — R69 Illness, unspecified: Secondary | ICD-10-CM | POA: Diagnosis not present

## 2019-08-21 DIAGNOSIS — I10 Essential (primary) hypertension: Secondary | ICD-10-CM | POA: Diagnosis not present

## 2019-08-21 DIAGNOSIS — Z8673 Personal history of transient ischemic attack (TIA), and cerebral infarction without residual deficits: Secondary | ICD-10-CM | POA: Diagnosis not present

## 2019-08-24 DIAGNOSIS — Z87891 Personal history of nicotine dependence: Secondary | ICD-10-CM | POA: Diagnosis not present

## 2019-08-24 DIAGNOSIS — R7303 Prediabetes: Secondary | ICD-10-CM | POA: Diagnosis not present

## 2019-08-24 DIAGNOSIS — J449 Chronic obstructive pulmonary disease, unspecified: Secondary | ICD-10-CM | POA: Diagnosis not present

## 2019-08-24 DIAGNOSIS — I10 Essential (primary) hypertension: Secondary | ICD-10-CM | POA: Diagnosis not present

## 2019-08-24 DIAGNOSIS — I119 Hypertensive heart disease without heart failure: Secondary | ICD-10-CM | POA: Diagnosis not present

## 2019-08-24 DIAGNOSIS — E782 Mixed hyperlipidemia: Secondary | ICD-10-CM | POA: Diagnosis not present

## 2019-08-24 DIAGNOSIS — I6789 Other cerebrovascular disease: Secondary | ICD-10-CM | POA: Diagnosis not present

## 2019-08-24 DIAGNOSIS — R05 Cough: Secondary | ICD-10-CM | POA: Diagnosis not present

## 2019-10-19 DIAGNOSIS — E782 Mixed hyperlipidemia: Secondary | ICD-10-CM | POA: Diagnosis not present

## 2019-10-19 DIAGNOSIS — I119 Hypertensive heart disease without heart failure: Secondary | ICD-10-CM | POA: Diagnosis not present

## 2019-10-19 DIAGNOSIS — Z87891 Personal history of nicotine dependence: Secondary | ICD-10-CM | POA: Diagnosis not present

## 2019-10-19 DIAGNOSIS — I10 Essential (primary) hypertension: Secondary | ICD-10-CM | POA: Diagnosis not present

## 2019-10-19 DIAGNOSIS — R7303 Prediabetes: Secondary | ICD-10-CM | POA: Diagnosis not present

## 2019-10-19 DIAGNOSIS — I6789 Other cerebrovascular disease: Secondary | ICD-10-CM | POA: Diagnosis not present

## 2019-10-19 DIAGNOSIS — J449 Chronic obstructive pulmonary disease, unspecified: Secondary | ICD-10-CM | POA: Diagnosis not present

## 2019-12-14 DIAGNOSIS — R69 Illness, unspecified: Secondary | ICD-10-CM | POA: Diagnosis not present

## 2020-01-25 DIAGNOSIS — Z87891 Personal history of nicotine dependence: Secondary | ICD-10-CM | POA: Diagnosis not present

## 2020-01-25 DIAGNOSIS — R7303 Prediabetes: Secondary | ICD-10-CM | POA: Diagnosis not present

## 2020-01-25 DIAGNOSIS — I119 Hypertensive heart disease without heart failure: Secondary | ICD-10-CM | POA: Diagnosis not present

## 2020-01-25 DIAGNOSIS — J449 Chronic obstructive pulmonary disease, unspecified: Secondary | ICD-10-CM | POA: Diagnosis not present

## 2020-01-25 DIAGNOSIS — E782 Mixed hyperlipidemia: Secondary | ICD-10-CM | POA: Diagnosis not present

## 2020-01-25 DIAGNOSIS — Z0001 Encounter for general adult medical examination with abnormal findings: Secondary | ICD-10-CM | POA: Diagnosis not present

## 2020-01-25 DIAGNOSIS — I10 Essential (primary) hypertension: Secondary | ICD-10-CM | POA: Diagnosis not present

## 2020-01-25 DIAGNOSIS — I6789 Other cerebrovascular disease: Secondary | ICD-10-CM | POA: Diagnosis not present

## 2020-03-28 DIAGNOSIS — R7303 Prediabetes: Secondary | ICD-10-CM | POA: Diagnosis not present

## 2020-03-28 DIAGNOSIS — E782 Mixed hyperlipidemia: Secondary | ICD-10-CM | POA: Diagnosis not present

## 2020-03-28 DIAGNOSIS — I6789 Other cerebrovascular disease: Secondary | ICD-10-CM | POA: Diagnosis not present

## 2020-03-28 DIAGNOSIS — I119 Hypertensive heart disease without heart failure: Secondary | ICD-10-CM | POA: Diagnosis not present

## 2020-03-28 DIAGNOSIS — J449 Chronic obstructive pulmonary disease, unspecified: Secondary | ICD-10-CM | POA: Diagnosis not present

## 2020-03-28 DIAGNOSIS — Z23 Encounter for immunization: Secondary | ICD-10-CM | POA: Diagnosis not present

## 2020-03-28 DIAGNOSIS — I10 Essential (primary) hypertension: Secondary | ICD-10-CM | POA: Diagnosis not present

## 2020-03-28 DIAGNOSIS — Z87891 Personal history of nicotine dependence: Secondary | ICD-10-CM | POA: Diagnosis not present

## 2020-06-04 IMAGING — CT CT HEAD W/O CM
3 series · 15 of 47 positions shown, 18 images · non-contrast
Comparison: January 04, 2015

CLINICAL DATA: Dizziness.  Ataxia.

EXAM:
CT HEAD WITHOUT CONTRAST
TECHNIQUE: Contiguous axial images were obtained from the base of the skull
through the vertex without intravenous contrast.

[Series 2: head wo · axial · 0.46mm/px · z∈[+730,+865]mm · 9 of 33 slices shown, 12 images]
[im 3/33  brain]
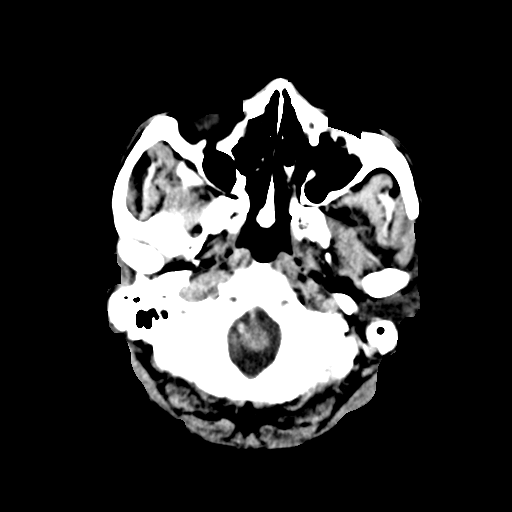
[im 3/33  bone]
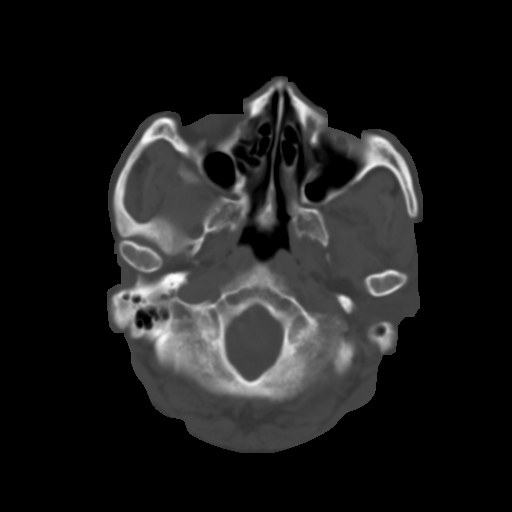
[im 6/33  brain]
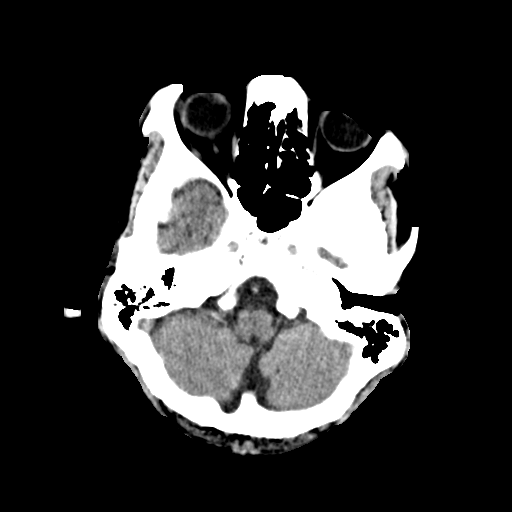
[im 9/33  brain]
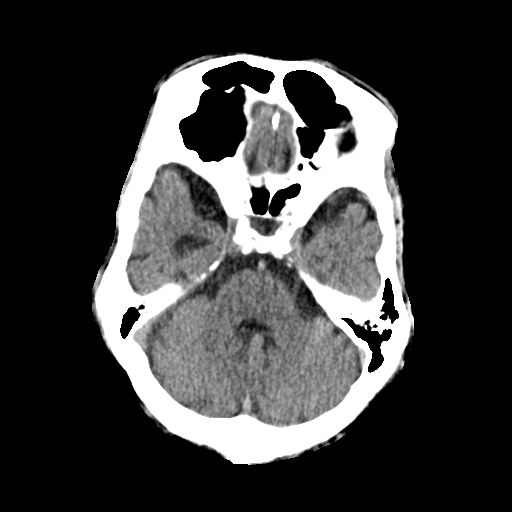
[im 13/33  brain]
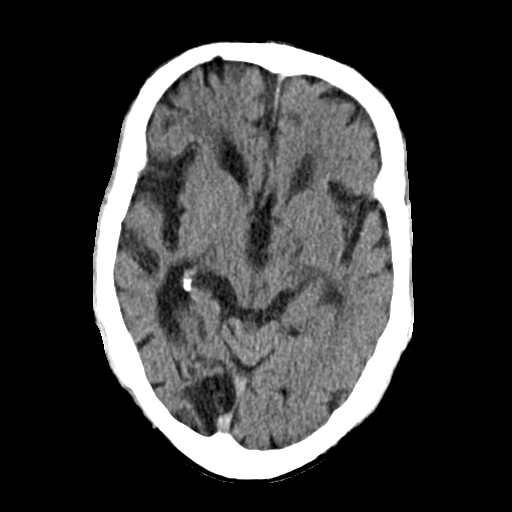
[im 17/33  brain]
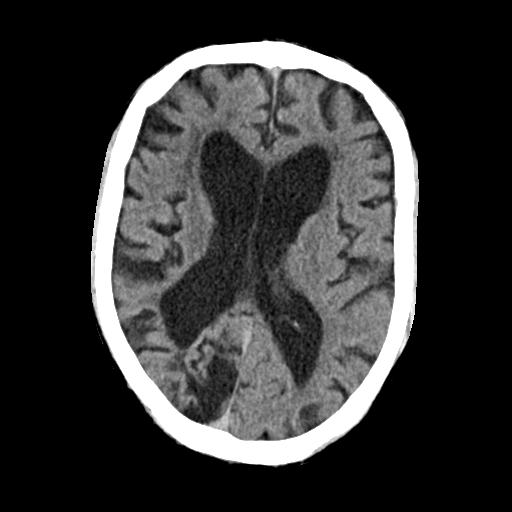
[im 17/33  bone]
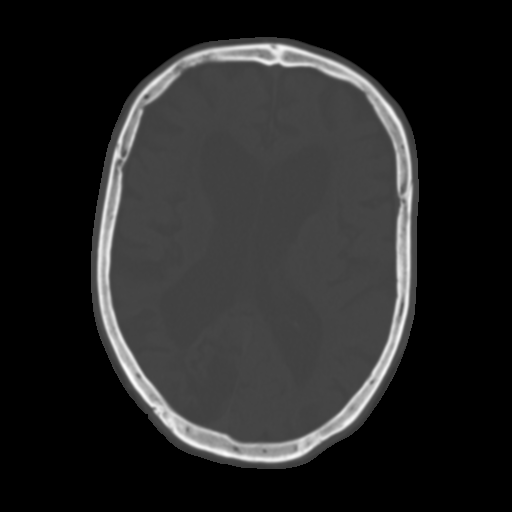
[im 20/33  brain]
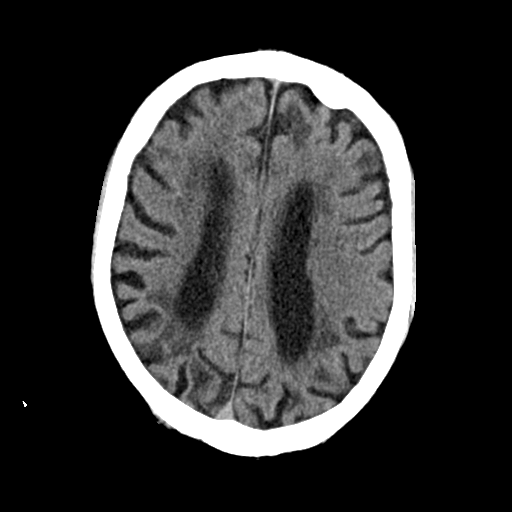
[im 24/33  brain]
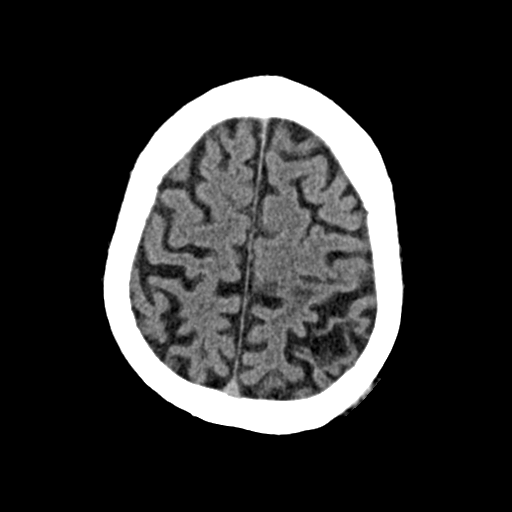
[im 27/33  brain]
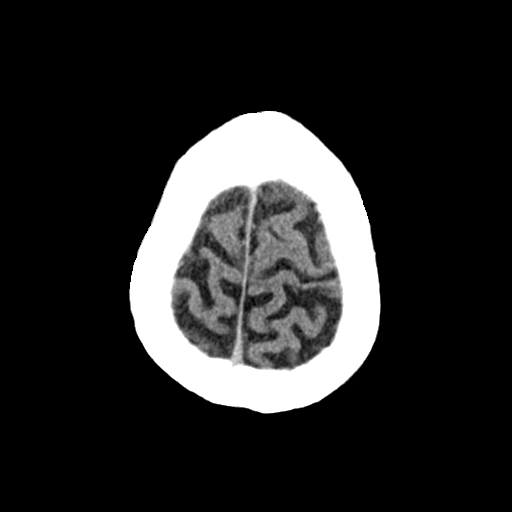
[im 30/33  brain]
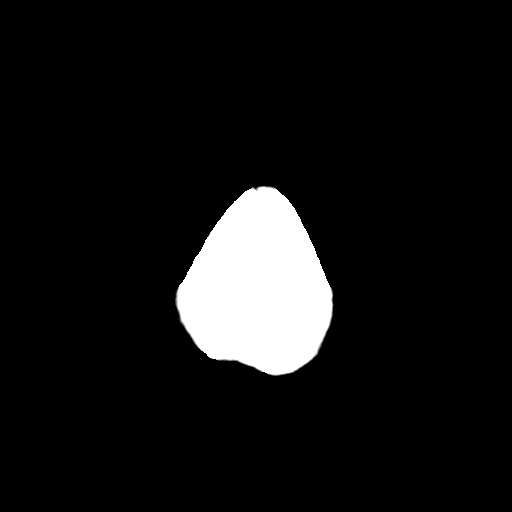
[im 30/33  bone]
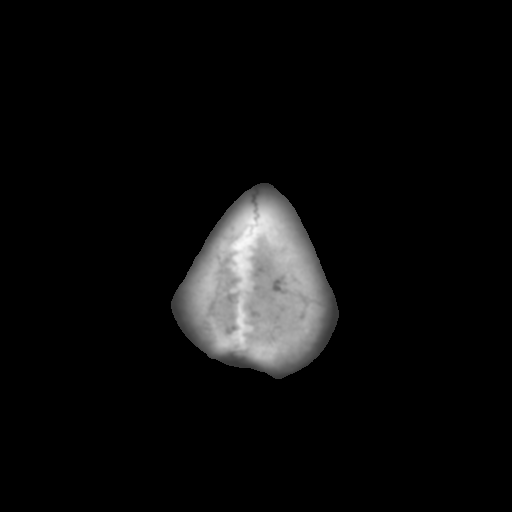

[Series 4: coronal soft · coronal · 0.34mm/px · 3 of 71 slices shown]
[im 24/71  brain]
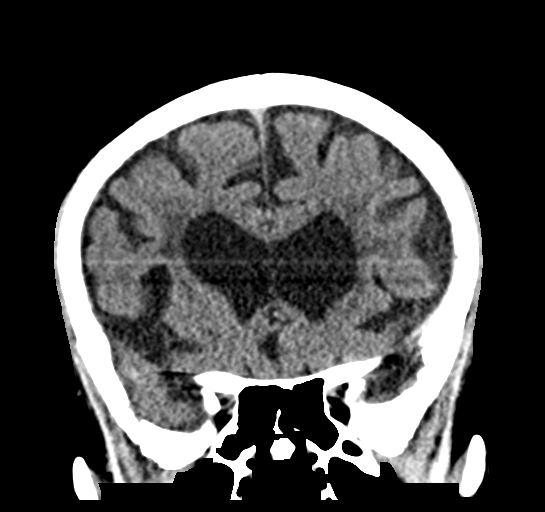
[im 32/71  brain]
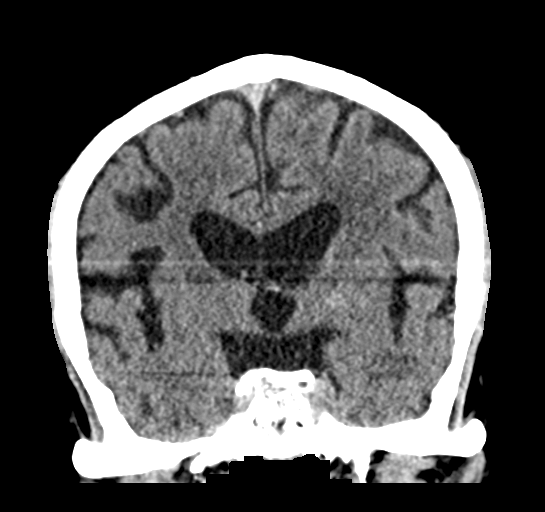
[im 39/71  brain]
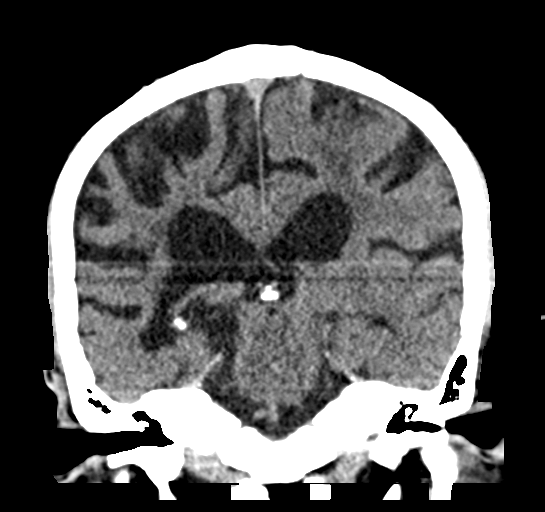

[Series 5: sag soft · sagittal · 0.34mm/px · 3 of 62 slices shown]
[im 21/62  brain]
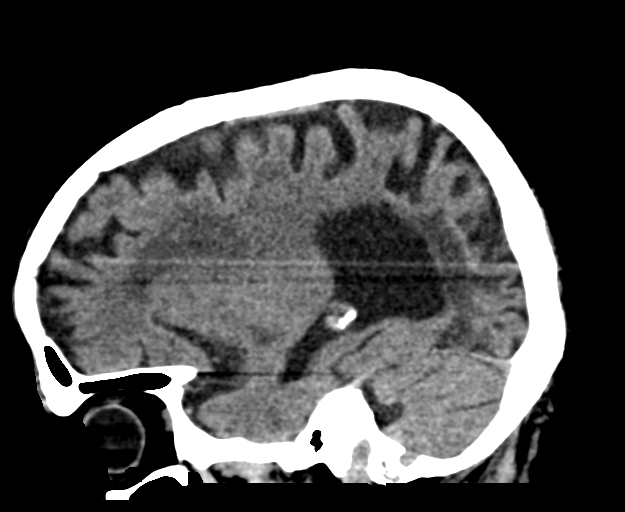
[im 31/62  brain]
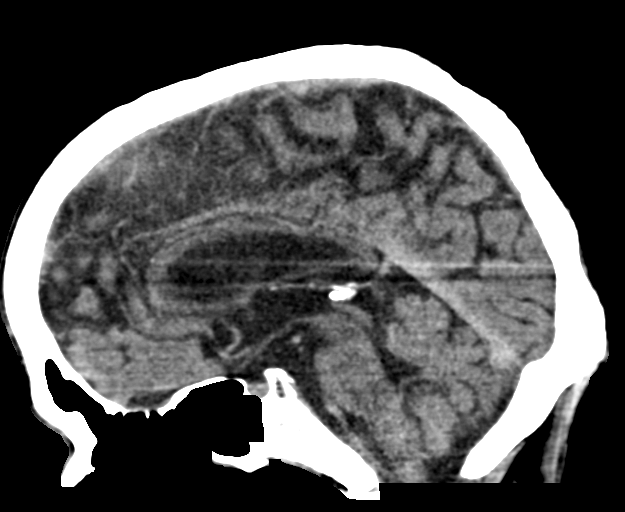
[im 41/62  brain]
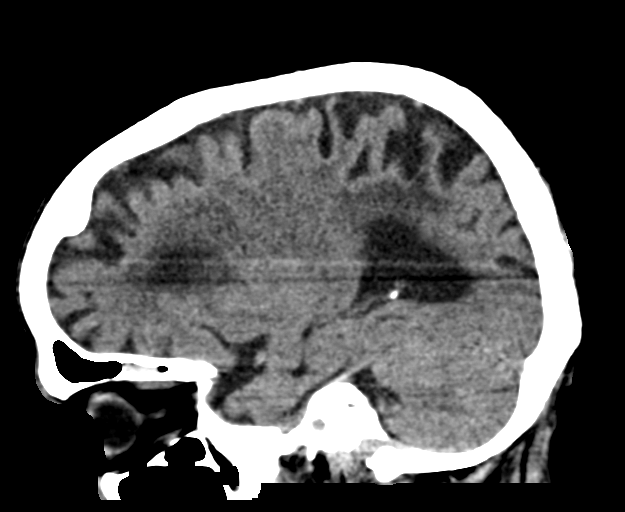

[15 of 47 positions shown; findings below may reference images not displayed]

FINDINGS: Brain: There is moderate diffuse atrophy. There is no intracranial
mass, hemorrhage, extra-axial fluid collection, or midline shift.
There is evidence of a prior infarct in the medial right occipital
lobe. There is evidence of a prior infarct in the posterior left
parietal lobe. There is patchy small vessel disease in the centra
semiovale bilaterally. There is evidence of a prior infarct
involving a portion of the head of the caudate nucleus on the left.
There is small vessel disease in the anterior limb of each internal
and external capsule. No acute infarct is evident.

Vascular: No hyperdense vessel. There is calcification in each
carotid siphon region.

Skull: The bony calvarium appears intact. There is a small left
parietal scalp hematoma.

Sinuses/Orbits: There is mucosal thickening in several ethmoid air
cells. There is slight mucosal thickening in the medial right
maxillary antrum. Other paranasal sinuses which are visualized are
clear. Orbits appear symmetric bilaterally.

Other: Mastoid air cells are clear.
IMPRESSION: Stable atrophy with prior infarcts as noted. No acute infarct
evident. No mass or hemorrhage.

There are foci of arterial vascular calcification. There is a small
left parietal scalp hematoma without underlying fracture. There are
areas of paranasal sinus disease.

## 2020-06-27 DIAGNOSIS — Z0001 Encounter for general adult medical examination with abnormal findings: Secondary | ICD-10-CM | POA: Diagnosis not present

## 2020-06-27 DIAGNOSIS — I119 Hypertensive heart disease without heart failure: Secondary | ICD-10-CM | POA: Diagnosis not present

## 2020-06-27 DIAGNOSIS — I6789 Other cerebrovascular disease: Secondary | ICD-10-CM | POA: Diagnosis not present

## 2020-06-27 DIAGNOSIS — J449 Chronic obstructive pulmonary disease, unspecified: Secondary | ICD-10-CM | POA: Diagnosis not present

## 2020-06-27 DIAGNOSIS — E782 Mixed hyperlipidemia: Secondary | ICD-10-CM | POA: Diagnosis not present

## 2020-06-27 DIAGNOSIS — I10 Essential (primary) hypertension: Secondary | ICD-10-CM | POA: Diagnosis not present

## 2020-06-27 DIAGNOSIS — Z87891 Personal history of nicotine dependence: Secondary | ICD-10-CM | POA: Diagnosis not present

## 2020-06-27 DIAGNOSIS — R7303 Prediabetes: Secondary | ICD-10-CM | POA: Diagnosis not present

## 2020-06-27 DIAGNOSIS — Z125 Encounter for screening for malignant neoplasm of prostate: Secondary | ICD-10-CM | POA: Diagnosis not present

## 2020-06-28 DIAGNOSIS — Z8673 Personal history of transient ischemic attack (TIA), and cerebral infarction without residual deficits: Secondary | ICD-10-CM | POA: Diagnosis not present

## 2020-06-28 DIAGNOSIS — R030 Elevated blood-pressure reading, without diagnosis of hypertension: Secondary | ICD-10-CM | POA: Diagnosis not present

## 2020-06-28 DIAGNOSIS — Z6832 Body mass index (BMI) 32.0-32.9, adult: Secondary | ICD-10-CM | POA: Diagnosis not present

## 2020-06-28 DIAGNOSIS — Z008 Encounter for other general examination: Secondary | ICD-10-CM | POA: Diagnosis not present

## 2020-06-28 DIAGNOSIS — Z87891 Personal history of nicotine dependence: Secondary | ICD-10-CM | POA: Diagnosis not present

## 2020-06-28 DIAGNOSIS — E669 Obesity, unspecified: Secondary | ICD-10-CM | POA: Diagnosis not present

## 2020-07-23 DIAGNOSIS — I10 Essential (primary) hypertension: Secondary | ICD-10-CM | POA: Diagnosis not present

## 2020-07-23 DIAGNOSIS — Z87891 Personal history of nicotine dependence: Secondary | ICD-10-CM | POA: Diagnosis not present

## 2020-07-23 DIAGNOSIS — R5383 Other fatigue: Secondary | ICD-10-CM | POA: Diagnosis not present

## 2020-07-23 DIAGNOSIS — I440 Atrioventricular block, first degree: Secondary | ICD-10-CM | POA: Diagnosis not present

## 2020-07-23 DIAGNOSIS — Z20822 Contact with and (suspected) exposure to covid-19: Secondary | ICD-10-CM | POA: Diagnosis not present

## 2020-07-23 DIAGNOSIS — R531 Weakness: Secondary | ICD-10-CM | POA: Diagnosis not present

## 2020-07-24 DIAGNOSIS — I440 Atrioventricular block, first degree: Secondary | ICD-10-CM | POA: Diagnosis not present

## 2020-07-25 DIAGNOSIS — Z87891 Personal history of nicotine dependence: Secondary | ICD-10-CM | POA: Diagnosis not present

## 2020-07-25 DIAGNOSIS — I10 Essential (primary) hypertension: Secondary | ICD-10-CM | POA: Diagnosis not present

## 2020-07-25 DIAGNOSIS — R7303 Prediabetes: Secondary | ICD-10-CM | POA: Diagnosis not present

## 2020-07-25 DIAGNOSIS — I6789 Other cerebrovascular disease: Secondary | ICD-10-CM | POA: Diagnosis not present

## 2020-07-25 DIAGNOSIS — J449 Chronic obstructive pulmonary disease, unspecified: Secondary | ICD-10-CM | POA: Diagnosis not present

## 2020-07-25 DIAGNOSIS — E782 Mixed hyperlipidemia: Secondary | ICD-10-CM | POA: Diagnosis not present

## 2020-07-25 DIAGNOSIS — I119 Hypertensive heart disease without heart failure: Secondary | ICD-10-CM | POA: Diagnosis not present

## 2020-12-26 DIAGNOSIS — I6789 Other cerebrovascular disease: Secondary | ICD-10-CM | POA: Diagnosis not present

## 2020-12-26 DIAGNOSIS — R7303 Prediabetes: Secondary | ICD-10-CM | POA: Diagnosis not present

## 2020-12-26 DIAGNOSIS — I10 Essential (primary) hypertension: Secondary | ICD-10-CM | POA: Diagnosis not present

## 2020-12-26 DIAGNOSIS — Z87891 Personal history of nicotine dependence: Secondary | ICD-10-CM | POA: Diagnosis not present

## 2020-12-26 DIAGNOSIS — E782 Mixed hyperlipidemia: Secondary | ICD-10-CM | POA: Diagnosis not present

## 2020-12-26 DIAGNOSIS — I119 Hypertensive heart disease without heart failure: Secondary | ICD-10-CM | POA: Diagnosis not present

## 2020-12-26 DIAGNOSIS — J449 Chronic obstructive pulmonary disease, unspecified: Secondary | ICD-10-CM | POA: Diagnosis not present

## 2021-01-16 DIAGNOSIS — I10 Essential (primary) hypertension: Secondary | ICD-10-CM | POA: Diagnosis not present

## 2021-01-16 DIAGNOSIS — I119 Hypertensive heart disease without heart failure: Secondary | ICD-10-CM | POA: Diagnosis not present

## 2021-01-16 DIAGNOSIS — I6789 Other cerebrovascular disease: Secondary | ICD-10-CM | POA: Diagnosis not present

## 2021-01-16 DIAGNOSIS — Z87891 Personal history of nicotine dependence: Secondary | ICD-10-CM | POA: Diagnosis not present

## 2021-01-16 DIAGNOSIS — R7303 Prediabetes: Secondary | ICD-10-CM | POA: Diagnosis not present

## 2021-01-16 DIAGNOSIS — J449 Chronic obstructive pulmonary disease, unspecified: Secondary | ICD-10-CM | POA: Diagnosis not present

## 2021-01-16 DIAGNOSIS — E782 Mixed hyperlipidemia: Secondary | ICD-10-CM | POA: Diagnosis not present

## 2021-01-23 DIAGNOSIS — Z87891 Personal history of nicotine dependence: Secondary | ICD-10-CM | POA: Diagnosis not present

## 2021-01-23 DIAGNOSIS — E782 Mixed hyperlipidemia: Secondary | ICD-10-CM | POA: Diagnosis not present

## 2021-01-23 DIAGNOSIS — I10 Essential (primary) hypertension: Secondary | ICD-10-CM | POA: Diagnosis not present

## 2021-01-23 DIAGNOSIS — I119 Hypertensive heart disease without heart failure: Secondary | ICD-10-CM | POA: Diagnosis not present

## 2021-01-23 DIAGNOSIS — Z0001 Encounter for general adult medical examination with abnormal findings: Secondary | ICD-10-CM | POA: Diagnosis not present

## 2021-01-23 DIAGNOSIS — J449 Chronic obstructive pulmonary disease, unspecified: Secondary | ICD-10-CM | POA: Diagnosis not present

## 2021-01-23 DIAGNOSIS — R7303 Prediabetes: Secondary | ICD-10-CM | POA: Diagnosis not present

## 2021-01-23 DIAGNOSIS — I6789 Other cerebrovascular disease: Secondary | ICD-10-CM | POA: Diagnosis not present

## 2021-05-01 DIAGNOSIS — M545 Low back pain, unspecified: Secondary | ICD-10-CM | POA: Diagnosis not present

## 2021-05-08 DIAGNOSIS — E782 Mixed hyperlipidemia: Secondary | ICD-10-CM | POA: Diagnosis not present

## 2021-05-08 DIAGNOSIS — J449 Chronic obstructive pulmonary disease, unspecified: Secondary | ICD-10-CM | POA: Diagnosis not present

## 2021-05-08 DIAGNOSIS — I6789 Other cerebrovascular disease: Secondary | ICD-10-CM | POA: Diagnosis not present

## 2021-05-08 DIAGNOSIS — I119 Hypertensive heart disease without heart failure: Secondary | ICD-10-CM | POA: Diagnosis not present

## 2021-05-08 DIAGNOSIS — I10 Essential (primary) hypertension: Secondary | ICD-10-CM | POA: Diagnosis not present

## 2021-05-08 DIAGNOSIS — R7303 Prediabetes: Secondary | ICD-10-CM | POA: Diagnosis not present

## 2021-05-08 DIAGNOSIS — Z87891 Personal history of nicotine dependence: Secondary | ICD-10-CM | POA: Diagnosis not present

## 2023-06-21 ENCOUNTER — Other Ambulatory Visit (HOSPITAL_BASED_OUTPATIENT_CLINIC_OR_DEPARTMENT_OTHER): Payer: Self-pay | Admitting: Physician Assistant

## 2023-06-21 DIAGNOSIS — R221 Localized swelling, mass and lump, neck: Secondary | ICD-10-CM

## 2023-06-28 ENCOUNTER — Encounter (HOSPITAL_BASED_OUTPATIENT_CLINIC_OR_DEPARTMENT_OTHER): Payer: Self-pay

## 2023-06-28 ENCOUNTER — Ambulatory Visit (HOSPITAL_BASED_OUTPATIENT_CLINIC_OR_DEPARTMENT_OTHER)
Admission: RE | Admit: 2023-06-28 | Discharge: 2023-06-28 | Disposition: A | Discharge status: Home / Self Care | Payer: 59 | Source: Ambulatory Visit | Attending: Physician Assistant | Admitting: Physician Assistant

## 2023-06-28 DIAGNOSIS — R221 Localized swelling, mass and lump, neck: Secondary | ICD-10-CM | POA: Diagnosis present

## 2023-06-28 MED ORDER — IOHEXOL 300 MG/ML  SOLN
75.0000 mL | Freq: Once | INTRAMUSCULAR | Status: AC | PRN
  Administered 2023-06-28: 75 mL via INTRAVENOUS

## 2023-07-09 NOTE — Progress Notes (Addendum)
 Acute Physical Therapy Treatment  PERTINENT MEDICAL INFORMATION  Precautions: Precautions Other Therapy Precautions: Aspiration, Fall risk Comment: NPO, SBP goal <200  Medical Diagnosis/Course: PT Diagnosis/Course Pertinent Medical Course: Patient admitted 1/18 with emesis, weakness, and dizziness. MRI brain + left acute right PICA territory infarct and Small focus of subacute ischemia in the right corona radiata. Old right occipital and left parietal infarcts. CT spine negative.  ASSESSMENT SUMMARY   Patient resting in supine and agreeable to participate with therapy session.  Patient's nephew present during session and video translator was used for improved communication.  Patient was able to transition to edge of bed with increased time.  Patient tolerated standing and was able to take steps to head of bed with physical assistance and cues for balance.  Patient continues to benefit from skilled services.  Will continue to follow patient per plan of care.  Problem list: Impairments/Limitations: Activity Tolerance Deficits, Ambulation Deficits, Balance Deficits, Bed mobility deficits, Pain limiting function, Transfer deficits, Safety Awareness deficits, Dizziness   PT Recommendation: PT Recommendation: Rehabilitation Facility PT Equipment Recommended: Defer to next level of care  Home Living Environment: Type of Home House  Home Layout Multi-level, Bedroom on main level, Performs ADLs on one level  Exterior Stairs - number 3  Exterior Stairs - rails Bilateral  Interior Stairs - number    Interior Stairs - Administrator, Sports / Tub Engineering Geologist, Grab bars in shower, Regular toilet height, Grab bars around toilet  [employer] Cane-single point  Equipment Currently Using    Additional Comments Wife provides assistance for ADLs and daughter-in-law provides IADLs.  Family provides transportation needs and medication  management.  Patient is typically independent with bed mobility, transfers, and household gait distances.  Patient uses single-point cane occasionally for community distances.   Prior Level of Function: Level of Independence Requiring assistance  Lives With Spouse, Son (daughter in social worker)  Person(s) able to assist at d/c wife 24/7 assist; son and daughter in law PRN  Patient Responsibilities Social Participation  Requires Assist With Home Management, LE Bathing, UE Dressing, LE Dressing, UE Bathing   SUBJECTIVE  Subjective:   Appearance:   Pt position on arrival: In supine  Informed Consent: Patient and Family Members: nephew alert, cooperative  Assessed in the Presence of: Therapy tech Stuart  Consent for therapy provided by: Nurse  Communication:  Interdisciplinary Team Communication Communication: Patient, Family/Caregiver, Nursing General Family/Caregiver Present: nephew Interpreter Used: Yes Provider ID: [id number] Language: Other (Comment) (Vietnamese)  Lines and Leads: bed/chair exit alarm, saline lock, and waffle cushion  OBJECTIVE   Pain: Pain Assessment Pain Assessment: No/denies pain Pain Score  : 8 Pain Type: Acute pain Pain Location: Head Pain Descriptors: Headache Pain Intervention(s): Medication (See MAR), RN notified (Comment), Emotional support  Cognition: Cognition Orientation Level: Not oriented Not Oriented to: Place, Time, Situation, Person (oriented to name only) Safety Awareness: Impaired Overall Cognitive Status: Impaired Following Commands: Requires increased time, Requires repetition, Inconsistent following commands  Balance: Sitting - Static: Minimal Assistance Sitting - Dynamic: Maximal Assistance Standing - Static: Moderate Assistance, 2 people, Verbal Cues, Tactile cues Standing - Dynamic: Moderate Assistance, 2 people, Tactile cues, Verbal Cues  Therapy activity vitals:   07/09/23 1343 07/09/23 1346  Vitals  Heart Rate 107  101  Heart Rate Source Monitor Monitor  BP (!) 175/100 (!) 158/103  MAP (mmHg) 120 119  BP Method Automatic Automatic  Patient Position Sitting  Lying   RN aware.  INTERVENTIONS  Functional Mobility:  Bed Mobility Supine to Sit: Maximal Assistance, 2 people, Extra time, Tactile cues, Verbal Cues Sit to Supine: Extra time, Tactile cues, Verbal Cues, Dependent, 2 people Bed Mobility Additional Comments: Patient able to scoot to middle of bed with verbal and tactile cues and supervision.  Patient required total assist +2 for scooting to head of bed. Transfers Assistive Device:  (B hand held assist) Sit to Stand: Moderate Assistance, 2 people, Extra time, Tactile cues, Verbal Cues Stand to Sit: Moderate Assistance, 2 people, Extra time, Tactile cues, Verbal Cues Mobility Gait Distance (ft):  (4 right lateral steps to head of bed) Gait Additional Comments: Patient with posterior lean and able to correct with tactile and verbal cues.  Patient able to sidestep to head of bed with cues and assistance. Assistive Device:  (B Hand heald assist)  Skilled PT Treatment Provided  Bed mobility, balance and standing   Education:  Education provided to: Patient Education provided regarding:  Role and POC of PT and Importance of increasing activity Response to Education: Patient verbalizes understanding and Patient demonstrates understanding  Skill Performed by Clinician:  Education on energy conservation for safe performance of functional tasks Monitoring safe progression of exercise/activity Tactile cues for proper technique Verbal cues for proper technique  Condition After Therapy:  Back to bed, Nursing notified of condition, Fall interventions in place, All needs within reach, Alarm activated, Lines intact  PERFORMANCE OUTCOME MEASURES  AM-PAC - Basic Mobility:    Flowsheet Row Most Recent Value  AM-PAC 6-Clicks - Basic Mobility  Turning from you back to your side while in a flat bed  without using bed rails? A lot  Moving from lying on your back to sitting on the side of a flat bed without using bed rails? A lot  Moving to and from a bed to a chair (including a wheelchair)? A lot  Standing up from a chair using your arms (e.g, wheelchair, or bedside chair)? A lot  To walk in a hospital room? A lot  Climbing 3-5 steps with a railing? Total Assist  AM-PAC Total Score 11    PT PLAN OF CARE  Rehab Potential:  Rehab Potential: Fair Complexity/Co-morbidities that Impact POC: Pain, Reduced activity level, Severity of condition, Cognitive/Memory deficits Impairments/Limitations: Activity Tolerance Deficits, Ambulation Deficits, Balance Deficits, Bed mobility deficits, Pain limiting function, Transfer deficits, Safety Awareness deficits, Dizziness  Plan:  Patient and Family Goals: none stated Treatment Plan/Goals Established with Patient/Caregiver: Yes Planned Treatment/Interventions: Patient/Caregiver education, Therapeutic activity, Therapeutic exercise, Gait/mobility training, Neuromuscular re-education PT Frequency: 3x week  PT Goals:  Encounter Problems     Encounter Problems (Active)     Physical Therapy     Patient Specific Goal 1     Start:  07/07/23    Expected End:  07/28/23      Patient will perform all aspects of bed mobility with minimal assist for repositioning and pressure relief.      Patient Specific Goal 2     Start:  07/07/23    Expected End:  07/28/23      Patient will perform all transfers to/from various heights and surfaces with moderate assist for safe out of bed activity.      Patient Specific Goal 3     Start:  07/07/23    Expected End:  07/28/23      Patient will ambulate 50 feet using LRAD with moderate assist for safe household mobility.  Patient Specific Goal 4     Start:  07/07/23    Expected End:  07/28/23      Patient will complete 10-15 reps of bilateral lower extremity therapeutic exercises/balance activities as  needed to improve limb stability and advancement during gait.         Patient Specific Goal 5     Start:  07/07/23    Expected End:  07/28/23      Patient will demonstrate improved standing balance from total assist x2 to moderate assist to help decrease risk of falls and improve gait stability.          TREATMENT TIME   Time In: 1327 Time Out: 03-Jun-1351 Time in Timed codes: Jun 10, 2024 Total Treatment Time: 2024-06-10     Treatment/Procedures Time Entry Therapeutic Activity minutes: 25     Charges           07/09/2023   Code Description Service Provider Modifiers Quantity  [id number] Hc Pt Therapeut Actvity Direct Pt Contact Each 15 Min Candace G Riedl, PTA GP, CQ 2        Time of Service Note Type Status  None

## 2023-08-20 DIAGNOSIS — E785 Hyperlipidemia, unspecified: Secondary | ICD-10-CM | POA: Diagnosis not present

## 2023-08-20 DIAGNOSIS — J439 Emphysema, unspecified: Secondary | ICD-10-CM | POA: Diagnosis not present

## 2023-08-20 DIAGNOSIS — I10 Essential (primary) hypertension: Secondary | ICD-10-CM | POA: Diagnosis not present

## 2023-08-20 DIAGNOSIS — I69398 Other sequelae of cerebral infarction: Secondary | ICD-10-CM | POA: Diagnosis not present

## 2023-08-20 DIAGNOSIS — R2681 Unsteadiness on feet: Secondary | ICD-10-CM | POA: Diagnosis not present

## 2023-08-20 DIAGNOSIS — R531 Weakness: Secondary | ICD-10-CM | POA: Diagnosis not present

## 2023-08-20 DIAGNOSIS — Z9181 History of falling: Secondary | ICD-10-CM | POA: Diagnosis not present

## 2023-08-21 DIAGNOSIS — R634 Abnormal weight loss: Secondary | ICD-10-CM | POA: Diagnosis not present

## 2023-08-21 DIAGNOSIS — I10 Essential (primary) hypertension: Secondary | ICD-10-CM | POA: Diagnosis not present

## 2023-08-21 DIAGNOSIS — Z8673 Personal history of transient ischemic attack (TIA), and cerebral infarction without residual deficits: Secondary | ICD-10-CM | POA: Diagnosis not present

## 2023-08-21 DIAGNOSIS — C8591 Non-Hodgkin lymphoma, unspecified, lymph nodes of head, face, and neck: Secondary | ICD-10-CM | POA: Diagnosis not present

## 2023-08-21 DIAGNOSIS — R7303 Prediabetes: Secondary | ICD-10-CM | POA: Diagnosis not present

## 2023-08-21 DIAGNOSIS — E782 Mixed hyperlipidemia: Secondary | ICD-10-CM | POA: Diagnosis not present

## 2023-08-23 DIAGNOSIS — R590 Localized enlarged lymph nodes: Secondary | ICD-10-CM | POA: Diagnosis not present

## 2023-08-26 DIAGNOSIS — I69398 Other sequelae of cerebral infarction: Secondary | ICD-10-CM | POA: Diagnosis not present

## 2023-08-26 DIAGNOSIS — C8511 Unspecified B-cell lymphoma, lymph nodes of head, face, and neck: Secondary | ICD-10-CM | POA: Diagnosis not present

## 2023-08-26 DIAGNOSIS — R531 Weakness: Secondary | ICD-10-CM | POA: Diagnosis not present

## 2023-08-26 DIAGNOSIS — C8590 Non-Hodgkin lymphoma, unspecified, unspecified site: Secondary | ICD-10-CM | POA: Diagnosis not present

## 2023-08-26 DIAGNOSIS — E785 Hyperlipidemia, unspecified: Secondary | ICD-10-CM | POA: Diagnosis not present

## 2023-08-26 DIAGNOSIS — I10 Essential (primary) hypertension: Secondary | ICD-10-CM | POA: Diagnosis not present

## 2023-08-26 DIAGNOSIS — Z9181 History of falling: Secondary | ICD-10-CM | POA: Diagnosis not present

## 2023-08-26 DIAGNOSIS — R2681 Unsteadiness on feet: Secondary | ICD-10-CM | POA: Diagnosis not present

## 2023-08-26 DIAGNOSIS — Z5111 Encounter for antineoplastic chemotherapy: Secondary | ICD-10-CM | POA: Diagnosis not present

## 2023-08-26 DIAGNOSIS — J439 Emphysema, unspecified: Secondary | ICD-10-CM | POA: Diagnosis not present

## 2023-09-02 DIAGNOSIS — R590 Localized enlarged lymph nodes: Secondary | ICD-10-CM | POA: Diagnosis not present

## 2023-09-05 DIAGNOSIS — R079 Chest pain, unspecified: Secondary | ICD-10-CM | POA: Diagnosis not present

## 2023-09-05 DIAGNOSIS — R0789 Other chest pain: Secondary | ICD-10-CM | POA: Diagnosis not present

## 2023-09-05 DIAGNOSIS — I440 Atrioventricular block, first degree: Secondary | ICD-10-CM | POA: Diagnosis not present

## 2023-09-17 DIAGNOSIS — I63541 Cerebral infarction due to unspecified occlusion or stenosis of right cerebellar artery: Secondary | ICD-10-CM | POA: Diagnosis not present

## 2023-09-24 DIAGNOSIS — J439 Emphysema, unspecified: Secondary | ICD-10-CM | POA: Diagnosis not present

## 2023-09-24 DIAGNOSIS — R2681 Unsteadiness on feet: Secondary | ICD-10-CM | POA: Diagnosis not present

## 2023-09-24 DIAGNOSIS — Z9181 History of falling: Secondary | ICD-10-CM | POA: Diagnosis not present

## 2023-09-24 DIAGNOSIS — E785 Hyperlipidemia, unspecified: Secondary | ICD-10-CM | POA: Diagnosis not present

## 2023-09-24 DIAGNOSIS — I69398 Other sequelae of cerebral infarction: Secondary | ICD-10-CM | POA: Diagnosis not present

## 2023-09-24 DIAGNOSIS — R531 Weakness: Secondary | ICD-10-CM | POA: Diagnosis not present

## 2023-09-24 DIAGNOSIS — I10 Essential (primary) hypertension: Secondary | ICD-10-CM | POA: Diagnosis not present

## 2023-10-27 DIAGNOSIS — I63541 Cerebral infarction due to unspecified occlusion or stenosis of right cerebellar artery: Secondary | ICD-10-CM | POA: Diagnosis not present

## 2023-10-30 DIAGNOSIS — R7303 Prediabetes: Secondary | ICD-10-CM | POA: Diagnosis not present

## 2023-10-30 DIAGNOSIS — C8591 Non-Hodgkin lymphoma, unspecified, lymph nodes of head, face, and neck: Secondary | ICD-10-CM | POA: Diagnosis not present

## 2023-10-30 DIAGNOSIS — I10 Essential (primary) hypertension: Secondary | ICD-10-CM | POA: Diagnosis not present

## 2023-10-30 DIAGNOSIS — Z8673 Personal history of transient ischemic attack (TIA), and cerebral infarction without residual deficits: Secondary | ICD-10-CM | POA: Diagnosis not present

## 2023-10-30 DIAGNOSIS — Z0001 Encounter for general adult medical examination with abnormal findings: Secondary | ICD-10-CM | POA: Diagnosis not present

## 2023-10-30 DIAGNOSIS — E782 Mixed hyperlipidemia: Secondary | ICD-10-CM | POA: Diagnosis not present

## 2023-11-07 DIAGNOSIS — Z79899 Other long term (current) drug therapy: Secondary | ICD-10-CM | POA: Diagnosis not present

## 2023-11-07 DIAGNOSIS — C8510 Unspecified B-cell lymphoma, unspecified site: Secondary | ICD-10-CM | POA: Diagnosis not present

## 2023-11-07 DIAGNOSIS — C8590 Non-Hodgkin lymphoma, unspecified, unspecified site: Secondary | ICD-10-CM | POA: Diagnosis not present

## 2023-11-25 DIAGNOSIS — C8201 Follicular lymphoma grade I, lymph nodes of head, face, and neck: Secondary | ICD-10-CM | POA: Diagnosis not present

## 2023-11-25 DIAGNOSIS — C8590 Non-Hodgkin lymphoma, unspecified, unspecified site: Secondary | ICD-10-CM | POA: Diagnosis not present

## 2023-11-25 DIAGNOSIS — Z5111 Encounter for antineoplastic chemotherapy: Secondary | ICD-10-CM | POA: Diagnosis not present

## 2023-11-25 DIAGNOSIS — C8510 Unspecified B-cell lymphoma, unspecified site: Secondary | ICD-10-CM | POA: Diagnosis not present

## 2023-11-27 DIAGNOSIS — I63541 Cerebral infarction due to unspecified occlusion or stenosis of right cerebellar artery: Secondary | ICD-10-CM | POA: Diagnosis not present

## 2023-12-27 DIAGNOSIS — I63541 Cerebral infarction due to unspecified occlusion or stenosis of right cerebellar artery: Secondary | ICD-10-CM | POA: Diagnosis not present

## 2024-01-27 DIAGNOSIS — I63541 Cerebral infarction due to unspecified occlusion or stenosis of right cerebellar artery: Secondary | ICD-10-CM | POA: Diagnosis not present

## 2024-01-29 DIAGNOSIS — C8591 Non-Hodgkin lymphoma, unspecified, lymph nodes of head, face, and neck: Secondary | ICD-10-CM | POA: Diagnosis not present

## 2024-01-29 DIAGNOSIS — I10 Essential (primary) hypertension: Secondary | ICD-10-CM | POA: Diagnosis not present

## 2024-01-29 DIAGNOSIS — E782 Mixed hyperlipidemia: Secondary | ICD-10-CM | POA: Diagnosis not present

## 2024-01-29 DIAGNOSIS — R7303 Prediabetes: Secondary | ICD-10-CM | POA: Diagnosis not present

## 2024-01-29 DIAGNOSIS — Z8673 Personal history of transient ischemic attack (TIA), and cerebral infarction without residual deficits: Secondary | ICD-10-CM | POA: Diagnosis not present

## 2024-02-27 DIAGNOSIS — I63541 Cerebral infarction due to unspecified occlusion or stenosis of right cerebellar artery: Secondary | ICD-10-CM | POA: Diagnosis not present

## 2024-03-28 DIAGNOSIS — I63541 Cerebral infarction due to unspecified occlusion or stenosis of right cerebellar artery: Secondary | ICD-10-CM | POA: Diagnosis not present

## 2024-04-29 ENCOUNTER — Emergency Department (HOSPITAL_BASED_OUTPATIENT_CLINIC_OR_DEPARTMENT_OTHER)

## 2024-04-29 ENCOUNTER — Encounter (HOSPITAL_BASED_OUTPATIENT_CLINIC_OR_DEPARTMENT_OTHER): Payer: Self-pay

## 2024-04-29 ENCOUNTER — Other Ambulatory Visit: Payer: Self-pay

## 2024-04-29 ENCOUNTER — Inpatient Hospital Stay (HOSPITAL_BASED_OUTPATIENT_CLINIC_OR_DEPARTMENT_OTHER)
Admission: EM | Admit: 2024-04-29 | Discharge: 2024-05-01 | DRG: 640 | Disposition: A | Discharge status: Home / Self Care | Source: Ambulatory Visit | Attending: Internal Medicine | Admitting: Internal Medicine

## 2024-04-29 DIAGNOSIS — R627 Adult failure to thrive: Secondary | ICD-10-CM | POA: Diagnosis present

## 2024-04-29 DIAGNOSIS — E860 Dehydration: Principal | ICD-10-CM | POA: Diagnosis present

## 2024-04-29 DIAGNOSIS — C8290 Follicular lymphoma, unspecified, unspecified site: Secondary | ICD-10-CM | POA: Diagnosis present

## 2024-04-29 DIAGNOSIS — I959 Hypotension, unspecified: Secondary | ICD-10-CM | POA: Diagnosis present

## 2024-04-29 DIAGNOSIS — I10 Essential (primary) hypertension: Secondary | ICD-10-CM | POA: Diagnosis present

## 2024-04-29 DIAGNOSIS — F0390 Unspecified dementia without behavioral disturbance: Secondary | ICD-10-CM | POA: Diagnosis present

## 2024-04-29 DIAGNOSIS — E162 Hypoglycemia, unspecified: Secondary | ICD-10-CM | POA: Diagnosis present

## 2024-04-29 DIAGNOSIS — Z7982 Long term (current) use of aspirin: Secondary | ICD-10-CM | POA: Diagnosis not present

## 2024-04-29 DIAGNOSIS — D75839 Thrombocytosis, unspecified: Secondary | ICD-10-CM | POA: Diagnosis present

## 2024-04-29 DIAGNOSIS — N179 Acute kidney failure, unspecified: Secondary | ICD-10-CM | POA: Diagnosis present

## 2024-04-29 DIAGNOSIS — I69354 Hemiplegia and hemiparesis following cerebral infarction affecting left non-dominant side: Secondary | ICD-10-CM

## 2024-04-29 DIAGNOSIS — E872 Acidosis, unspecified: Secondary | ICD-10-CM | POA: Diagnosis present

## 2024-04-29 DIAGNOSIS — R7989 Other specified abnormal findings of blood chemistry: Secondary | ICD-10-CM

## 2024-04-29 DIAGNOSIS — N170 Acute kidney failure with tubular necrosis: Secondary | ICD-10-CM | POA: Diagnosis present

## 2024-04-29 DIAGNOSIS — E7800 Pure hypercholesterolemia, unspecified: Secondary | ICD-10-CM | POA: Diagnosis present

## 2024-04-29 HISTORY — DX: Unspecified dementia, unspecified severity, without behavioral disturbance, psychotic disturbance, mood disturbance, and anxiety: F03.90

## 2024-04-29 LAB — COMPREHENSIVE METABOLIC PANEL WITH GFR
ALT: 18 U/L (ref 0–44)
AST: 22 U/L (ref 15–41)
Albumin: 5.1 g/dL — ABNORMAL HIGH (ref 3.5–5.0)
Alkaline Phosphatase: 82 U/L (ref 38–126)
Anion gap: 14 (ref 5–15)
BUN: 31 mg/dL — ABNORMAL HIGH (ref 8–23)
CO2: 27 mmol/L (ref 22–32)
Calcium: 10.8 mg/dL — ABNORMAL HIGH (ref 8.9–10.3)
Chloride: 98 mmol/L (ref 98–111)
Creatinine, Ser: 1.31 mg/dL — ABNORMAL HIGH (ref 0.61–1.24)
GFR, Estimated: 56 mL/min — ABNORMAL LOW (ref >60)
Glucose, Bld: 43 mg/dL — CL (ref 70–99)
Potassium: 4.2 mmol/L (ref 3.5–5.1)
Sodium: 139 mmol/L (ref 135–145)
Total Bilirubin: 0.6 mg/dL (ref 0.0–1.2)
Total Protein: 9 g/dL — ABNORMAL HIGH (ref 6.5–8.1)

## 2024-04-29 LAB — CBC WITH DIFFERENTIAL/PLATELET
Abs Immature Granulocytes: 0.02 K/uL (ref 0.00–0.07)
Basophils Absolute: 0 K/uL (ref 0.0–0.1)
Basophils Relative: 1 %
Eosinophils Absolute: 0.1 K/uL (ref 0.0–0.5)
Eosinophils Relative: 2 %
HCT: 54 % — ABNORMAL HIGH (ref 39.0–52.0)
Hemoglobin: 18.2 g/dL — ABNORMAL HIGH (ref 13.0–17.0)
Immature Granulocytes: 0 %
Lymphocytes Relative: 21 %
Lymphs Abs: 1.4 K/uL (ref 0.7–4.0)
MCH: 30.2 pg (ref 26.0–34.0)
MCHC: 33.7 g/dL (ref 30.0–36.0)
MCV: 89.6 fL (ref 80.0–100.0)
Monocytes Absolute: 0.8 K/uL (ref 0.1–1.0)
Monocytes Relative: 11 %
Neutro Abs: 4.4 K/uL (ref 1.7–7.7)
Neutrophils Relative %: 65 %
Platelets: 213 K/uL (ref 150–400)
RBC: 6.03 MIL/uL — ABNORMAL HIGH (ref 4.22–5.81)
RDW: 12.9 % (ref 11.5–15.5)
WBC: 6.7 K/uL (ref 4.0–10.5)
nRBC: 0 % (ref 0.0–0.2)

## 2024-04-29 LAB — URINALYSIS, W/ REFLEX TO CULTURE (INFECTION SUSPECTED)
Bilirubin Urine: NEGATIVE
Glucose, UA: NEGATIVE mg/dL
Ketones, ur: NEGATIVE mg/dL
Leukocytes,Ua: NEGATIVE
Nitrite: NEGATIVE
Protein, ur: NEGATIVE mg/dL
Specific Gravity, Urine: 1.015 (ref 1.005–1.030)
Squamous Epithelial / HPF: NONE SEEN /HPF (ref 0–5)
WBC, UA: NONE SEEN WBC/hpf (ref 0–5)
pH: 6.5 (ref 5.0–8.0)

## 2024-04-29 LAB — CBG MONITORING, ED
Glucose-Capillary: 130 mg/dL — ABNORMAL HIGH (ref 70–99)
Glucose-Capillary: 73 mg/dL (ref 70–99)
Glucose-Capillary: 135 mg/dL — ABNORMAL HIGH (ref 70–99)
Glucose-Capillary: 100 mg/dL — ABNORMAL HIGH (ref 70–99)

## 2024-04-29 LAB — LACTIC ACID, PLASMA
Lactic Acid, Venous: 2 mmol/L (ref 0.5–1.9)
Lactic Acid, Venous: 3.6 mmol/L (ref 0.5–1.9)
Lactic Acid, Venous: 3.7 mmol/L (ref 0.5–1.9)
Lactic Acid, Venous: 1.2 mmol/L (ref 0.5–1.9)

## 2024-04-29 LAB — TROPONIN T, HIGH SENSITIVITY: Troponin T High Sensitivity: <15 ng/L (ref 0–19)

## 2024-04-29 MED ORDER — LACTATED RINGERS IV SOLN: INTRAVENOUS | Status: AC

## 2024-04-29 MED ORDER — ONDANSETRON HCL 4 MG/2ML IJ SOLN: 4.0000 mg | Freq: Four times a day (QID) | INTRAMUSCULAR | Status: DC | PRN

## 2024-04-29 MED ORDER — ENSURE PLUS HIGH PROTEIN PO LIQD
237.0000 mL | Freq: Three times a day (TID) | ORAL | Status: DC
  Administered 2024-04-30 – 2024-05-01 (×4): 237 mL via ORAL

## 2024-04-29 MED ORDER — TRAZODONE HCL 50 MG PO TABS
25.0000 mg | ORAL_TABLET | Freq: Every evening | ORAL | Status: DC | PRN
  Administered 2024-04-30: 25 mg via ORAL
  Filled 2024-04-29: qty 1

## 2024-04-29 MED ORDER — IOHEXOL 300 MG/ML  SOLN
100.0000 mL | Freq: Once | INTRAMUSCULAR | Status: AC | PRN
  Administered 2024-04-29: 100 mL via INTRAVENOUS

## 2024-04-29 MED ORDER — HEPARIN SODIUM (PORCINE) 5000 UNIT/ML IJ SOLN
5000.0000 [IU] | Freq: Three times a day (TID) | INTRAMUSCULAR | Status: DC
  Administered 2024-04-30 – 2024-05-01 (×4): 5000 [IU] via SUBCUTANEOUS
  Filled 2024-04-29 (×4): qty 1

## 2024-04-29 MED ORDER — DONEPEZIL HCL 5 MG PO TABS
5.0000 mg | ORAL_TABLET | Freq: Every day | ORAL | Status: DC
  Administered 2024-04-29 – 2024-04-30 (×2): 5 mg via ORAL
  Filled 2024-04-29 (×2): qty 1

## 2024-04-29 MED ORDER — ACETAMINOPHEN 325 MG PO TABS: 650.0000 mg | ORAL_TABLET | Freq: Four times a day (QID) | ORAL | Status: DC | PRN

## 2024-04-29 MED ORDER — ASPIRIN 81 MG PO TBEC
81.0000 mg | DELAYED_RELEASE_TABLET | Freq: Every day | ORAL | Status: DC
  Administered 2024-04-30 – 2024-05-01 (×2): 81 mg via ORAL
  Filled 2024-04-29 (×2): qty 1

## 2024-04-29 MED ORDER — LACTATED RINGERS IV BOLUS
1000.0000 mL | Freq: Once | INTRAVENOUS | Status: AC
  Administered 2024-04-29: 1000 mL via INTRAVENOUS

## 2024-04-29 MED ORDER — ACETAMINOPHEN 650 MG RE SUPP: 650.0000 mg | Freq: Four times a day (QID) | RECTAL | Status: DC | PRN

## 2024-04-29 MED ORDER — ROSUVASTATIN CALCIUM 10 MG PO TABS
20.0000 mg | ORAL_TABLET | Freq: Every day | ORAL | Status: DC
  Administered 2024-04-30 – 2024-05-01 (×2): 20 mg via ORAL
  Filled 2024-04-29 (×2): qty 2

## 2024-04-29 MED ORDER — ALBUTEROL SULFATE (2.5 MG/3ML) 0.083% IN NEBU: 2.5000 mg | INHALATION_SOLUTION | RESPIRATORY_TRACT | Status: DC | PRN

## 2024-04-29 MED ORDER — ONDANSETRON HCL 4 MG PO TABS: 4.0000 mg | ORAL_TABLET | Freq: Four times a day (QID) | ORAL | Status: DC | PRN

## 2024-04-29 NOTE — ED Notes (Signed)
 Pt awake and verbally responsive . Skin warm and dry. Provided apple juice to drink.

## 2024-04-29 NOTE — ED Provider Notes (Signed)
 Clinical Course as of 04/29/24 1951  Wed Apr 29, 2024  1500 Received signout; see prior team's note for full HPI.  Signed out pending completion of workup/UA, IV fluids and repeat lactate. [TY]  1607 Lactic Acid, Venous(!!): 3.7 Has not cleared despite IV fluids.  Unclear cause of elevation.  He is seemingly retaining urine currently after multiple attempts to urinate.  Will place Foley catheter.  Will also get CT scan to evaluate for mechanical cause for his urinary retention.  However, given his overall clinical picture we will plan for admission/observation. [TY]  1801 CT ABDOMEN PELVIS W CONTRAST IMPRESSION: 1. No acute localizing process in the abdomen or pelvis. 2. Stable central mesenteric haziness with multiple small central mesenteric lymph nodes. Findings are nonspecific and can be seen in the setting of mesenteric panniculitis. 3. Aortic atherosclerosis.  Aortic Atherosclerosis (ICD10-I70.0).   Electronically Signed   By: Greig Pique M.D.   [TY]  1836 Spoke to Dr. Arthea who agrees to admit patient for IVFs/AKI.  [TY]    Clinical Course User Index [TY] Neysa Caron PARAS, DO      Neysa Caron PARAS, [place] 04/29/24 1951

## 2024-04-29 NOTE — H&P (Signed)
 History and Physical  Jake Page FMW:[id number] DOB: [birth year] DOA: 04/29/2024  PCP: Pcp, No   Chief Complaint: weakness   HPI: Jake Page is a 78 y.o. male with medical history significant for low-grade follicular lymphoma on surveillance, dementia, hypertension, prior history of CVA being admitted to the hospital with dehydration, AKI and failure to thrive.  History is provided by the patient as well as his wife were at the bedside.  With the assistance of electronic Vietnamese interpreter.  They tell me that overall the patient is been doing well, patient and his wife insist that the patient has been eating and drinking a normal amount, they deny any nausea, vomiting, diarrhea, fevers, chills or any other concerns.  They state that the patient is weak and that is why he came to the ER for evaluation.  However when asked specifically, they state that the patient is able to ambulate with minimal difficulty without assistive devices, although the patient mentions that he prefers to use a wheelchair.  When asked specifically if he has increased focal or generalized weakness, they deny that he has any focal weakness which is new, and wife also mentions that he seems to be moving around and have strength in his extremities consistent with his baseline.  Workup in the emergency department demonstrates unremarkable vital signs, however the patient seems to have a mild AKI, had initially elevated lactate which peaked at 7.3 and improved with IV fluids.  CBC also demonstrates elevated hemoglobin of 18.2.  Given his AKI, lactic acidosis and dehydration, hospitalist admission was requested.  Review of Systems: Please see HPI for pertinent positives and negatives. A complete 10 system review of systems are otherwise negative.  Past Medical History:  Diagnosis Date   Dementia (HCC)    Hepatitis B    High cholesterol    Hypertension    Stroke [facility])    History reviewed. No pertinent surgical  history. Social History:  reports that he has never smoked. He has never used smokeless tobacco. He reports that he does not currently use alcohol. No history on file for drug use.  No Known Allergies  History reviewed. No pertinent family history.   Prior to Admission medications   Medication Sig Start Date End Date Taking? Authorizing Provider  amLODipine (NORVASC) 2.5 MG tablet Take 2.5 mg by mouth daily.    [provider]  aspirin EC 81 MG tablet Take 81 mg by mouth daily.    [provider]  donepezil (ARICEPT) 5 MG tablet Take 5 mg by mouth at bedtime.    [provider]  olmesartan-hydrochlorothiazide (BENICAR HCT) 20-12.5 MG tablet Take 1 tablet by mouth daily.    [provider]  rosuvastatin (CRESTOR) 20 MG tablet Take 20 mg by mouth daily.    [provider]  simvastatin (ZOCOR) 20 MG tablet Take 20 mg by mouth daily.    [provider]    Physical Exam: BP 121/72 (BP Location: Right Arm)   Pulse (!) 101   Temp 97.9 F (36.6 C) (Oral)   Resp 16   SpO2 97%  General:  Alert, oriented to self and place, calm, in no acute distress, his wife is at the bedside.  He looks overall dry on exam.  Mucous membranes are dry. Cardiovascular: RRR, no murmurs or rubs, no peripheral edema  Respiratory: clear to auscultation bilaterally, no wheezes, no crackles  Abdomen: soft, nontender, nondistended, normal bowel tones heard  Skin: dry, no rashes  Musculoskeletal: no joint  effusions, normal range of motion  Psychiatric: appropriate affect, normal speech  Neurologic: extraocular muscles intact, clear speech, moving all extremities with 4/5 strength with intact sensorium         Labs on Admission:  Basic Metabolic Panel: Recent Labs  Lab 04/29/24 1238  NA 139  K 4.2  CL 98  CO2 27  GLUCOSE 43*  BUN 31*  CREATININE 1.31*  CALCIUM 10.8*   Liver Function Tests: Recent Labs  Lab 04/29/24 1238  AST 22  ALT 18  ALKPHOS 82   BILITOT 0.6  PROT 9.0*  ALBUMIN 5.1*   No results for input(s): LIPASE, AMYLASE in the last 168 hours. No results for input(s): AMMONIA in the last 168 hours. CBC: Recent Labs  Lab 04/29/24 1238  WBC 6.7  NEUTROABS 4.4  HGB 18.2*  HCT 54.0*  MCV 89.6  PLT 213   Cardiac Enzymes: No results for input(s): CKTOTAL, CKMB, CKMBINDEX, TROPONINI in the last 168 hours. BNP (last 3 results) No results for input(s): BNP in the last 8760 hours.  ProBNP (last 3 results) No results for input(s): PROBNP in the last 8760 hours.  CBG: Recent Labs  Lab 04/29/24 1152 04/29/24 1326 04/29/24 1444 04/29/24 1617  GLUCAP 130* 73 135* 100*    Radiological Exams on Admission: CT ABDOMEN PELVIS W CONTRAST Result Date: 04/29/2024 CLINICAL DATA:  Sepsis EXAM: CT ABDOMEN AND PELVIS WITH CONTRAST TECHNIQUE: Multidetector CT imaging of the abdomen and pelvis was performed using the standard protocol following bolus administration of intravenous contrast. RADIATION DOSE REDUCTION: This exam was performed according to the departmental dose-optimization program which includes automated exposure control, adjustment of the mA and/or kV according to patient size and/or use of iterative reconstruction technique. CONTRAST:  OMNIPAQUE  IOHEXOL  300 MG/ML  SOLN COMPARISON:  CT abdomen and pelvis 07/06/2023 FINDINGS: Lower chest: No acute abnormality. Hepatobiliary: No focal liver abnormality is seen. No gallstones, gallbladder wall thickening, or biliary dilatation. Pancreas: Unremarkable. No pancreatic ductal dilatation or surrounding inflammatory changes. Spleen: Bladder is distended, but otherwise within normal limits. There is no hydronephrosis. There are rounded cortical hypodensities in both kidneys which are too small to characterize. The adrenal glands are within normal limits. Adrenals/Urinary Tract: Adrenal glands are unremarkable. Kidneys are normal, without renal calculi, focal  lesion, or hydronephrosis. Bladder is unremarkable. Stomach/Bowel: Stomach is within normal limits. Appendix appears normal. No evidence of bowel wall thickening, distention, or inflammatory changes. Vascular/Lymphatic: Aortic atherosclerosis. No enlarged abdominal or pelvic lymph nodes. Reproductive: Prostate is unremarkable. Other: There central mesenteric haziness with multiple small central mesenteric lymph nodes, unchanged from prior. There is no ascites. Musculoskeletal: No fracture is seen. IMPRESSION: 1. No acute localizing process in the abdomen or pelvis. 2. Stable central mesenteric haziness with multiple small central mesenteric lymph nodes. Findings are nonspecific and can be seen in the setting of mesenteric panniculitis. 3. Aortic atherosclerosis. Aortic Atherosclerosis (ICD10-I70.0). Electronically Signed   By: Greig Pique M.D.   On: 04/29/2024 17:55   CT Head Wo Contrast Result Date: 04/29/2024 EXAM: CT HEAD WITHOUT CONTRAST 04/29/2024 01:24:00 PM TECHNIQUE: CT of the head was performed without the administration of intravenous contrast. Automated exposure control, iterative reconstruction, and/or weight based adjustment of the mA/kV was utilized to reduce the radiation dose to as low as reasonably achievable. COMPARISON: CT head 07/09/2023 and MRI brain 07/06/2023. CLINICAL HISTORY: Mental status change, unknown cause. FINDINGS: BRAIN AND VENTRICLES: No acute intracranial hemorrhage. No mass effect or evidence of acute infarction. No hydrocephalus. No  extra-axial collection. No midline shift. There is overall similar moderate-to-severe brain atrophy with ventricular enlargement, advanced for the patient's stated age. There is ex vacuo dilatation of the right occipital and temporal horns. Moderate-to-severe microvascular ischemic changes of the cerebral white matter are present. Re-demonstrated regions of encephalomalacia involving the right occipital lobe, left frontal lobe, and left parietal  lobe. There is chronic evolution of encephalomalacia involving the right cerebellum, consistent with chronic infarction. ORBITS: No acute abnormality. SINUSES: No acute abnormality. SOFT TISSUES AND SKULL: No acute soft tissue abnormality. No skull fracture. IMPRESSION: 1. No acute intracranial hemorrhage, mass effect, or evidence of acute infarction. 2. Since 07/09/2023, chronic evolution of right cerebellar infarction. 3. Overall similar to moderate-to-severe brain atrophy, chronic microvascular ischemic changes of the cerebral white matter, and bilateral cerebral infarctions. Electronically signed by: prentice bybordi 04/29/2024 01:55 PM EST RP Workstation: [id number]   DG Chest Portable 1 View Result Date: 04/29/2024 EXAM: 1 VIEW(S) XRAY OF THE CHEST 04/29/2024 12:25:00 PM COMPARISON: 09/05/2023 CLINICAL HISTORY: weakness weakness weakness FINDINGS: LUNGS AND PLEURA: No focal pulmonary opacity. No pulmonary edema. No pleural effusion. No pneumothorax. HEART AND MEDIASTINUM: No acute abnormality of the cardiac and mediastinal silhouettes. BONES AND SOFT TISSUES: No acute osseous abnormality. IMPRESSION: 1. No acute cardiopulmonary disease. Electronically signed by: Lynwood Seip MD 04/29/2024 01:06 PM EST RP Workstation: [id number]   Assessment/Plan Jake Page is a 78 y.o. male with medical history significant for low-grade follicular lymphoma on surveillance, dementia, hypertension, prior history of CVA being admitted to the hospital with dehydration, AKI and failure to thrive.  Acute kidney injury-superimposed on what appears to be baseline normal renal function, his creatinine was 0.95 in June 2025.  Likely has prerenal ATN from significant dehydration, which is corroborated by his thrombocytosis and poor urine output despite fluids in the ER. -Inpatient admission -Avoid nephrotoxins -Continue aggressive fluid resuscitation -Continue Foley catheter placed in the ER -Trend renal function with daily  labs  Lactic acidosis-I suspect this is due to dehydration, no evidence of sepsis or acute infection.  Lactic acidosis has resolved with fluid resuscitation.  History of CVA-with residual left-sided weakness, continue aspirin and Crestor.  PT/OT consult.  Dementia-Aricept  Grade 1 follicular lymphoma-followed by Dr. Leverette at [facility], on active biannual surveillance  DVT prophylaxis: Hep SQ    Code Status: Full Code  Consults called: None  Admission status: The appropriate patient status for this patient is INPATIENT. Inpatient status is judged to be reasonable and necessary in order to provide the required intensity of service to ensure the patient's safety. The patient's presenting symptoms, physical exam findings, and initial radiographic and laboratory data in the context of their chronic comorbidities is felt to place them at high risk for further clinical deterioration. Furthermore, it is not anticipated that the patient will be medically stable for discharge from the hospital within 2 midnights of admission.    I certify that at the point of admission it is my clinical judgment that the patient will require inpatient hospital care spanning beyond 2 midnights from the point of admission due to high intensity of service, high risk for further deterioration and high frequency of surveillance required  Time spent: 53 minutes  Jake Page Gail MD [facility] Pager [phone]  If 7PM-7AM, please contact night-coverage [email or web address] Password [email or web address]  04/29/2024, 11:19 PM

## 2024-04-29 NOTE — ED Provider Notes (Signed)
 [place] [facility] AT [facility] Provider Note   CSN: [id number] Arrival date & time: 04/29/24  1137     Patient presents with: Hypotension   Jake Page is a 78 y.o. male.   HPI 79 year old male presents with hypotension.  History is from daughter at the bedside.  Patient has a longstanding history of dementia that has progressively been worsening for years.  He also has a relatively recent diagnosis of cancer though according to daughter it is unclear what is the primary.  He has been getting weaker for months and somewhat more confused for months.  Today he went to a regular scheduled PCP appointment and his blood pressure was in the 90s on multiple checks and so he was sent here for evaluation.  Patient has not had any specific complaints according to daughter.  He currently denies chest or abdominal pain.  No fevers, vomiting, diarrhea, etc. No new meds.  Prior to Admission medications   Medication Sig Start Date End Date Taking? Authorizing Provider  amLODipine (NORVASC) 2.5 MG tablet Take 2.5 mg by mouth daily.    [provider]  aspirin EC 81 MG tablet Take 81 mg by mouth daily.    [provider]  donepezil (ARICEPT) 5 MG tablet Take 5 mg by mouth at bedtime.    [provider]  olmesartan-hydrochlorothiazide (BENICAR HCT) 20-12.5 MG tablet Take 1 tablet by mouth daily.    [provider]  rosuvastatin (CRESTOR) 20 MG tablet Take 20 mg by mouth daily.    [provider]  simvastatin (ZOCOR) 20 MG tablet Take 20 mg by mouth daily.    [provider]    Allergies: Patient has no known allergies.    Review of Systems  Unable to perform ROS: Dementia    Updated Vital Signs BP 121/77   Pulse 82   Temp (!) 97.5 F (36.4 C)   Resp 16   SpO2 100%   Physical Exam Vitals and nursing note reviewed.  Constitutional:      General: He is not in acute distress.    Appearance: He is  well-developed. He is not ill-appearing or diaphoretic.  HENT:     Head: Normocephalic and atraumatic.  Eyes:     Pupils: Pupils are equal, round, and reactive to light.  Cardiovascular:     Rate and Rhythm: Normal rate and regular rhythm.     Heart sounds: Normal heart sounds.  Pulmonary:     Effort: Pulmonary effort is normal.     Breath sounds: Normal breath sounds. No wheezing.  Abdominal:     General: There is no distension.     Palpations: Abdomen is soft.     Tenderness: There is no abdominal tenderness.  Musculoskeletal:     Cervical back: No rigidity.  Skin:    General: Skin is warm and dry.  Neurological:     Mental Status: He is alert.     Comments: Awake, alert, no facial droop. 5/5 strength in all 4 extremities.     (all labs ordered are listed, but only abnormal results are displayed) Labs Reviewed  COMPREHENSIVE METABOLIC PANEL WITH GFR - Abnormal; Notable for the following components:      Result Value   Glucose, Bld 43 (*)    BUN 31 (*)    Creatinine, Ser 1.31 (*)    Calcium 10.8 (*)    Total Protein 9.0 (*)    Albumin 5.1 (*)    GFR, Estimated  56 (*)    All other components within normal limits  CBC WITH DIFFERENTIAL/PLATELET - Abnormal; Notable for the following components:   RBC 6.03 (*)    Hemoglobin 18.2 (*)    HCT 54.0 (*)    All other components within normal limits  LACTIC ACID, PLASMA - Abnormal; Notable for the following components:   Lactic Acid, Venous 2.0 (*)    All other components within normal limits  LACTIC ACID, PLASMA - Abnormal; Notable for the following components:   Lactic Acid, Venous 3.6 (*)    All other components within normal limits  CBG MONITORING, ED - Abnormal; Notable for the following components:   Glucose-Capillary 130 (*)    All other components within normal limits  CBG MONITORING, ED - Abnormal; Notable for the following components:   Glucose-Capillary 135 (*)    All other components within normal limits   CULTURE, BLOOD (ROUTINE X 2)  CULTURE, BLOOD (ROUTINE X 2)  URINALYSIS, W/ REFLEX TO CULTURE (INFECTION SUSPECTED)  CBG MONITORING, ED  CBG MONITORING, ED  CBG MONITORING, ED  CBG MONITORING, ED  TROPONIN T, HIGH SENSITIVITY    EKG: EKG Interpretation Date/Time:  Wednesday April 29 2024 11:51:25 EST Ventricular Rate:  92 PR Interval:  199 QRS Duration:  83 QT Interval:  344 QTC Calculation: 426 R Axis:   -53  Text Interpretation: Sinus rhythm Left anterior fascicular block Anterior infarct, old Interpretation limited secondary to artifact otherwise appears similar to 2020 Confirmed by Freddi Hamilton [phone]) on 04/29/2024 12:34:49 PM  Radiology: CT Head Wo Contrast Result Date: 04/29/2024 EXAM: CT HEAD WITHOUT CONTRAST 04/29/2024 01:24:00 PM TECHNIQUE: CT of the head was performed without the administration of intravenous contrast. Automated exposure control, iterative reconstruction, and/or weight based adjustment of the mA/kV was utilized to reduce the radiation dose to as low as reasonably achievable. COMPARISON: CT head 07/09/2023 and MRI brain 07/06/2023. CLINICAL HISTORY: Mental status change, unknown cause. FINDINGS: BRAIN AND VENTRICLES: No acute intracranial hemorrhage. No mass effect or evidence of acute infarction. No hydrocephalus. No extra-axial collection. No midline shift. There is overall similar moderate-to-severe brain atrophy with ventricular enlargement, advanced for the patient's stated age. There is ex vacuo dilatation of the right occipital and temporal horns. Moderate-to-severe microvascular ischemic changes of the cerebral white matter are present. Re-demonstrated regions of encephalomalacia involving the right occipital lobe, left frontal lobe, and left parietal lobe. There is chronic evolution of encephalomalacia involving the right cerebellum, consistent with chronic infarction. ORBITS: No acute abnormality. SINUSES: No acute abnormality. SOFT TISSUES AND  SKULL: No acute soft tissue abnormality. No skull fracture. IMPRESSION: 1. No acute intracranial hemorrhage, mass effect, or evidence of acute infarction. 2. Since 07/09/2023, chronic evolution of right cerebellar infarction. 3. Overall similar to moderate-to-severe brain atrophy, chronic microvascular ischemic changes of the cerebral white matter, and bilateral cerebral infarctions. Electronically signed by: prentice bybordi 04/29/2024 01:55 PM EST RP Workstation: [id number]   DG Chest Portable 1 View Result Date: 04/29/2024 EXAM: 1 VIEW(S) XRAY OF THE CHEST 04/29/2024 12:25:00 PM COMPARISON: 09/05/2023 CLINICAL HISTORY: weakness weakness weakness FINDINGS: LUNGS AND PLEURA: No focal pulmonary opacity. No pulmonary edema. No pleural effusion. No pneumothorax. HEART AND MEDIASTINUM: No acute abnormality of the cardiac and mediastinal silhouettes. BONES AND SOFT TISSUES: No acute osseous abnormality. IMPRESSION: 1. No acute cardiopulmonary disease. Electronically signed by: Lynwood Seip MD 04/29/2024 01:06 PM EST RP Workstation: [id number]     Procedures   Medications Ordered in the ED  lactated ringers bolus 1,000  mL (0 mLs Intravenous Stopped 04/29/24 1402)  lactated ringers bolus 1,000 mL (1,000 mLs Intravenous New Bag/Given 04/29/24 1443)    Clinical Course as of 04/29/24 1503  Wed Apr 29, 2024  1500 Received signout; see prior team's note for full HPI.  Signed out pending completion of workup/UA, IV fluids and repeat lactate. [TY]    Clinical Course User Index [TY] Neysa Caron PARAS, DO                                 Medical Decision Making Amount and/or Complexity of Data Reviewed Independent Historian:     Details: Wife and daughter Labs: ordered.    Details: Mild AKI, lactic acidosis, hypoglycemia and hypercalcemia Radiology: ordered and independent interpretation performed.    Details: No head bleed ECG/medicine tests: ordered and independent interpretation performed.    Details:  No acute ischemia   Patient presents with generalized weakness and hypotension.  Unclear if this is infectious versus dehydration from poor p.o. intake and more of a failure to thrive picture.  Besides the soft blood pressures he does not meet other SIRS criteria.  Lactate was found to be 2.0, suspected due to to some dehydration but then it surprisingly jumped up to 3.6.  He will be given a second liter of fluid.  He was hypoglycemic on his CMP but had low normal on his fingerstick.  He was allowed to eat and has had unremarkable mental status since.  He is well-appearing and his blood pressure is improving with the fluids.  The lactate jumping from 2 up to 3.6 is surprising.  Given this we will repeat in case this was a lab error or technique error.  Urine is still pending.  Care transferred to Dr. Neysa.     Final diagnoses:  None    ED Discharge Orders     None          Freddi Hamilton, MD 04/29/24 1504

## 2024-04-29 NOTE — ED Provider Notes (Signed)
.  Ultrasound ED Peripheral IV (Provider)  Date/Time: 04/29/2024 6:05 PM  Performed by: Neldon Hamp RAMAN, PA Authorized by: Neldon Hamp RAMAN, PA   Procedure details:    Indications: multiple failed IV attempts     Skin Prep: chlorhexidine gluconate     Location:  Left AC   Angiocath:  20 G   Bedside Ultrasound Guided: Yes     Images: not archived     Patient tolerated procedure without complications: Yes     Dressing applied: Yes       Neldon Hamp RAMAN, PA 04/29/24 1805    Neysa Caron PARAS, DO 04/29/24 1951

## 2024-04-29 NOTE — ED Notes (Signed)
 Pt was able to urinate into urinal. output.

## 2024-04-29 NOTE — ED Triage Notes (Signed)
 Sent by PCP for low BP. Intermittent dizziness and headache.   Hx of dementia, family states his answers are unreliable  Oriented to name, not birthday, place, time, situation

## 2024-04-29 NOTE — ED Notes (Signed)
 EDP notified of lactic acid 3.7.

## 2024-04-29 NOTE — ED Notes (Signed)
 Pt informed on need for urine sample. States he is unable to at this time.

## 2024-04-29 NOTE — ED Notes (Signed)
 Bladder scanned pt after unsuccessful attempt to void. Scan showed . EDP updated.

## 2024-04-30 DIAGNOSIS — N179 Acute kidney failure, unspecified: Secondary | ICD-10-CM | POA: Diagnosis not present

## 2024-04-30 LAB — BASIC METABOLIC PANEL WITH GFR
Anion gap: 7 (ref 5–15)
BUN: 18 mg/dL (ref 8–23)
CO2: 28 mmol/L (ref 22–32)
Calcium: 9.2 mg/dL (ref 8.9–10.3)
Chloride: 105 mmol/L (ref 98–111)
Creatinine, Ser: 0.93 mg/dL (ref 0.61–1.24)
GFR, Estimated: >60 mL/min (ref >60)
Glucose, Bld: 92 mg/dL (ref 70–99)
Potassium: 4 mmol/L (ref 3.5–5.1)
Sodium: 140 mmol/L (ref 135–145)

## 2024-04-30 LAB — CBC
HCT: 42.6 % (ref 39.0–52.0)
Hemoglobin: 14.7 g/dL (ref 13.0–17.0)
MCH: 30.7 pg (ref 26.0–34.0)
MCHC: 34.5 g/dL (ref 30.0–36.0)
MCV: 88.9 fL (ref 80.0–100.0)
Platelets: 168 K/uL (ref 150–400)
RBC: 4.79 MIL/uL (ref 4.22–5.81)
RDW: 12.8 % (ref 11.5–15.5)
WBC: 6.2 K/uL (ref 4.0–10.5)
nRBC: 0 % (ref 0.0–0.2)

## 2024-04-30 MED ORDER — ORAL CARE MOUTH RINSE: 15.0000 mL | OROMUCOSAL | Status: DC | PRN

## 2024-04-30 NOTE — Plan of Care (Signed)

## 2024-04-30 NOTE — Evaluation (Signed)
 Physical Therapy Evaluation Patient Details Name: Jake Page MRN: [id number] DOB: 14-Jun-1946 Today's Date: 04/30/2024  History of Present Illness  Jake Page is a 78 y.o. male admitted 04/29/24 to the hospital with dehydration, AKI and failure to thrivewith medical history significant for low-grade follicular lymphoma on surveillance, dementia, hypertension, prior history of CVA  Clinical Impression   Patient and wife very appreciative of ambulating with patient. Patient ambulated ~ 54' then asked to walk farther.  Interpreter used. Patient resides with spouse and daughter, uses a ? SW at baseline, wife  very helpful and supportive. No followup PT recommended.    Patient did report some dizziness , BP stable.  No post acute PT recommended. Pt admitted with above diagnosis.  Pt currently with functional limitations due to the deficits listed below (see PT Problem List). Pt will benefit from acute skilled PT to increase their independence and safety with mobility to allow discharge.         If plan is discharge home, recommend the following: A little help with walking and/or transfers;A little help with bathing/dressing/bathroom;Assistance with cooking/housework;Help with stairs or ramp for entrance;Assist for transportation   Can travel by private vehicle        Equipment Recommendations None recommended by PT  Recommendations for Other Services       Functional Status Assessment Patient has had a recent decline in their functional status and demonstrates the ability to make significant improvements in function in a reasonable and predictable amount of time.     Precautions / Restrictions Precautions Precautions: Fall Restrictions Weight Bearing Restrictions Per Provider Order: No      Mobility  Bed Mobility Overal bed mobility: Modified Independent                  Transfers Overall transfer level: Needs assistance Equipment used: Rolling walker (2  wheels) Transfers: Sit to/from Stand Sit to Stand: Supervision           General transfer comment: has been able to ambulate in room with wife assisting,    Ambulation/Gait Ambulation/Gait assistance: Contact guard assist Gait Distance (Feet): 200 Feet Assistive device: Rolling walker (2 wheels) Gait Pattern/deviations: Step-through pattern Gait velocity: decr     General Gait Details: gait steady with RW  Stairs            Wheelchair Mobility     Tilt Bed    Modified Rankin (Stroke Patients Only)       Balance Overall balance assessment: Mild deficits observed, not formally tested                                           Pertinent Vitals/Pain Pain Assessment Pain Assessment: No/denies pain    Home Living Family/patient expects to be discharged to:: Private residence Living Arrangements: Spouse/significant other;Children Available Help at Discharge: Family Type of Home: Apartment Home Access: Stairs to enter   Secretary/administrator of Steps: 3   Home Layout: Two level;Able to live on main level with bedroom/bathroom Home Equipment: Standard Walker Additional Comments: sounds like SW    Prior Function Prior Level of Function : Needs assist  Cognitive Assist : Mobility (cognitive);ADLs (cognitive) Mobility (Cognitive): Intermittent cues ADLs (Cognitive): Intermittent cues Physical Assist : Mobility (physical) Mobility (physical): Transfers;Gait;Stairs   Mobility Comments: amulates in home, family present       Extremity/Trunk Assessment  Lower Extremity Assessment Lower Extremity Assessment: Generalized weakness    Cervical / Trunk Assessment Cervical / Trunk Assessment: Normal  Communication   Communication Communication: Impaired Factors Affecting Communication: Non - English speaking, interpreter used   Cognition Arousal: Alert Behavior During Therapy: WFL for tasks assessed/performed   PT - Cognitive  impairments: History of cognitive impairments                       PT - Cognition Comments: vick interpreter [phone], wife frequently corrects pt. via interpreter , wife reports that patient has memory deficits so she will correct answers Following commands: Impaired Following commands impaired: Follows one step commands inconsistently     Cueing Cueing Techniques: Verbal cues, Tactile cues     General Comments      Exercises     Assessment/Plan    PT Assessment Patient needs continued PT services  PT Problem List Decreased strength;Decreased activity tolerance;Decreased mobility;Decreased cognition       PT Treatment Interventions DME instruction;Gait training;Functional mobility training;Therapeutic activities;Patient/family education    PT Goals (Current goals can be found in the Care Plan section)  Acute Rehab PT Goals Patient Stated Goal: go home PT Goal Formulation: With patient/family Time For Goal Achievement: 05/14/24 Potential to Achieve Goals: Good    Frequency Min 2X/week     Co-evaluation               AM-PAC PT 6 Clicks Mobility  Outcome Measure Help needed turning from your back to your side while in a flat bed without using bedrails?: None Help needed moving from lying on your back to sitting on the side of a flat bed without using bedrails?: None Help needed moving to and from a bed to a chair (including a wheelchair)?: A Little Help needed standing up from a chair using your arms (e.g., wheelchair or bedside chair)?: A Little Help needed to walk in hospital room?: A Little Help needed climbing 3-5 steps with a railing? : A Little 6 Click Score: 20    End of Session Equipment Utilized During Treatment: Gait belt Activity Tolerance: Patient tolerated treatment well Patient left: in bed;with call bell/phone within reach;with family/visitor present Nurse Communication: Mobility status PT Visit Diagnosis: Unsteadiness on feet  (R26.81)    Time: 8392-8364 PT Time Calculation (min) (ACUTE ONLY): 28 min   Charges:   PT Evaluation $PT Eval Low Complexity: 1 Low PT Treatments $Gait Training: 8-22 mins PT General Charges $$ ACUTE PT VISIT: 1 Visit         Darice Potters PT Acute Rehabilitation Services Office [phone]   Potters Darice Norris 04/30/2024, 5:24 PM

## 2024-04-30 NOTE — Plan of Care (Signed)

## 2024-04-30 NOTE — Progress Notes (Signed)
 Progress Note   Patient: Jake Page FMW:[id number] DOB: 27-Jul-1945 DOA: 04/29/2024     1 DOS: the patient was seen and examined on 04/30/2024   Brief hospital course: 78 y.o. male with medical history significant for low-grade follicular lymphoma on surveillance, dementia, hypertension, prior history of CVA being admitted to the hospital with dehydration, AKI and failure to thrive. Daughter tells me patient had poor oral intake at home and at PCP appointment his BP was in the 70s systolic then he was told to come over to the ER. Labs concerning for elevated serum creatinine. Patient was admitted for AKI, dehydration and lactic acidosis  Assessment and Plan: Jake Page is a 78 y.o. male with medical history significant for low-grade follicular lymphoma on surveillance, dementia, hypertension, prior history of CVA being admitted to the hospital with dehydration, AKI and failure to thrive.   Acute kidney injury-superimposed on what appears to be baseline normal renal function, his creatinine was 0.95 in June 2025.  Likely has prerenal ATN from significant dehydration, which is corroborated by his thrombocytosis and poor urine output despite fluids in the ER.  11//13 -Scr improved to 0.93 with IV hydration. -Avoid nephrotoxins -Continue aggressive fluid resuscitation -Continue Foley catheter placed in the ER -Trend renal function with daily labs   Lactic acidosis - suspect this is due to dehydration, no evidence of sepsis or acute infection.  Lactic acidosis has resolved with fluid resuscitation.   History of CVA-with residual left-sided weakness, continue aspirin and Crestor.  PT/OT consult.   Dementia-Aricept   Grade 1 follicular lymphoma-followed by Dr. Leverette at [facility], on active biannual surveillance   DVT prophylaxis: Hep SQ     Code Status: Full Code       Subjective: Seen lying in bed, Daughter states patient has not been eating and drinking well, she was concerned about  his BP   Physical Exam: Vitals:   04/29/24 2041 04/30/24 0030 04/30/24 0551 04/30/24 0831  BP: 121/72 118/70 131/66 137/68  Pulse: (!) 101 87 82 83  Resp: 16 17 16 16   Temp: 97.9 F (36.6 C) (!) 97.3 F (36.3 C) (!) 97.4 F (36.3 C) (!) 97.4 F (36.3 C)  TempSrc: Oral Oral Oral Oral  SpO2: 97%  98% 97%    General  Lethargic, easily arousable Eyes: PERRL, lids and conjunctivae normal ENMT: Mucous membranes are moist.   Neck: normal, supple, no masses, no thyromegaly Respiratory: clear to auscultation bilaterally, no wheezing, no crackles. Normal respiratory effort. No accessory muscle use.  Cardiovascular: Regular rate and rhythm, no murmurs / rubs / gallops Abdomen: Soft, nontender nondistended. Musculoskeletal: no clubbing / cyanosis. No joint deformity upper and lower extremities.  Skin: no rashes, lesions, ulcers. No induration Neurologic: Facial asymmetry, moving extremity spontaneously, speech fluent. Psychiatric: Normal judgment and insight. Alert and oriented x 3. Normal mood.   Data Reviewed:    CBC    Component Value Date/Time   WBC 6.2 04/30/2024 0450   RBC 4.79 04/30/2024 0450   HGB 14.7 04/30/2024 0450   HCT 42.6 04/30/2024 0450   PLT 168 04/30/2024 0450   MCV 88.9 04/30/2024 0450   MCH 30.7 04/30/2024 0450   MCHC 34.5 04/30/2024 0450   RDW 12.8 04/30/2024 0450   LYMPHSABS 1.4 04/29/2024 1238   MONOABS 0.8 04/29/2024 1238   EOSABS 0.1 04/29/2024 1238   BASOSABS 0.0 04/29/2024 1238     CMP     Component Value Date/Time   NA 140 04/30/2024 0450   K  4.0 04/30/2024 0450   CL 105 04/30/2024 0450   CO2 28 04/30/2024 0450   GLUCOSE 92 04/30/2024 0450   BUN 18 04/30/2024 0450   CREATININE 0.93 04/30/2024 0450   CALCIUM 9.2 04/30/2024 0450   PROT 9.0 (H) 04/29/2024 1238   ALBUMIN 5.1 (H) 04/29/2024 1238   AST 22 04/29/2024 1238   ALT 18 04/29/2024 1238   ALKPHOS 82 04/29/2024 1238   BILITOT 0.6 04/29/2024 1238   GFRNONAA >60 04/30/2024 0450     Family Communication: Daughter at bedside  Disposition: Status is: Inpatient Remains inpatient appropriate because: poor oral intake  Planned Discharge Destination: Home    Time spent: 36 minutes  Author: Landon FORBES Baller, MD 04/30/2024 12:32 PM  For on call review [email or web address].

## 2024-05-01 DIAGNOSIS — N179 Acute kidney failure, unspecified: Secondary | ICD-10-CM | POA: Diagnosis not present

## 2024-05-01 MED ORDER — OLANZAPINE 10 MG IM SOLR
2.5000 mg | Freq: Once | INTRAMUSCULAR | Status: AC | PRN
  Filled 2024-05-01: qty 10

## 2024-05-01 MED ORDER — HALOPERIDOL LACTATE 5 MG/ML IJ SOLN
2.0000 mg | Freq: Once | INTRAMUSCULAR | Status: AC
  Administered 2024-05-01: 2 mg via INTRAVENOUS
  Filled 2024-05-01: qty 1

## 2024-05-01 MED ORDER — ENSURE PLUS HIGH PROTEIN PO LIQD: 237.0000 mL | Freq: Three times a day (TID) | ORAL | 0 refills | Status: AC

## 2024-05-01 NOTE — Progress Notes (Signed)
 Patient continuously getting up out of bed.  Using the video interpreter, patient has been advised multiple times to stay in bed, to no avail.  Wife is at bedside, but is unable to get him to stay in bed.

## 2024-05-01 NOTE — Plan of Care (Signed)

## 2024-05-01 NOTE — Progress Notes (Signed)
 While nurse was attempting to administer heparin injection, patient grabbed the syringe and attempted to stick nurse with it.  Nurse was successful in taking the syringe away from patient without getting stuck.

## 2024-05-03 NOTE — Discharge Summary (Signed)
 Physician Discharge Summary   Patient: Jake Page MRN: [id number] DOB: 1945-11-12  Admit date:     04/29/2024  Discharge date: 05/01/2024  Discharge Physician: Brigida Bureau   PCP: Pcp, No   Recommendations at discharge:    Discharge to home with family. Drink 1.5 L water daily. Hold Olmesartan/hydrochlorothiazide until follow up with PCP who will decide whether or not to continue it. Follow up with PCP in 7-14 days. Chemistry to be checked on that visit and to be reported to PCP.  Discharge Diagnoses: Principal Problem:   AKI (acute kidney injury) Failure to thrive Lactic acidosis History of CVA Dementia Grade 1 follicular lymphoma  Hospital Course: 78 y.o. male with medical history significant for low-grade follicular lymphoma on surveillance, dementia, hypertension, prior history of CVA being admitted to the hospital with dehydration, AKI and failure to thrive. Daughter tells me patient had poor oral intake at home and at PCP appointment his BP was in the 70s systolic then he was told to come over to the ER. Labs concerning for elevated serum creatinine. Patient was admitted for AKI, dehydration and lactic acidosis.  It was thought that the patient likely had ATN due to her poor PO intake. Her baseline creatinine from June of 2024 was 0.95. She initially also had poor urinary output. The patient was given IV fluid resuscitation. She was encouraged to increase her PO intake.   Nephrotoxins were avoided. Foley catheter was placed to assist with measurement of urinary output. Her creatinine, volume status, and electrolytes were carefully followed.  On 05/01/2024 the patient was much more alert. Her oral intake of fluids had improved. Her creatinine had returned to 0.93. She was hemodynamically stable.  She was discharged to home in fair condition.  Assessment and Plan:  AKI/FTT -Scr improved to 0.93 with IV hydration. -PO intake of fluids had improved.    Lactic acidosis -   Lactic acidosis has resolved with fluid resuscitation. Likely due to the patient's poor PO intake.   History of CVA-with residual left-sided weakness, continue aspirin and Crestor.     Dementia-Aricept   Grade 1 follicular lymphoma-followed by Dr. Leverette at [facility], on active biannual surveillance   DVT prophylaxis: Hep SQ     Code Status: Full Code      Consultants: None Procedures performed: None  Disposition: Home Diet recommendation:  Discharge Diet Orders (From admission, onward)     Start     Ordered   05/01/24 0000  Diet - low sodium heart healthy        05/01/24 1046           Cardiac diet DISCHARGE MEDICATION: Allergies as of 05/01/2024   No Known Allergies      Medication List     STOP taking these medications    olmesartan-hydrochlorothiazide 20-12.5 MG tablet Commonly known as: BENICAR HCT   simvastatin 20 MG tablet Commonly known as: ZOCOR       TAKE these medications    amLODipine 2.5 MG tablet Commonly known as: NORVASC Take 2.5 mg by mouth daily.   aspirin EC 81 MG tablet Take 81 mg by mouth daily.   donepezil 5 MG tablet Commonly known as: ARICEPT Take 5 mg by mouth at bedtime.   feeding supplement Liqd Take 237 mLs by mouth 3 (three) times daily between meals.   rosuvastatin 20 MG tablet Commonly known as: CRESTOR Take 20 mg by mouth daily.        Discharge Exam: There were no vitals  filed for this visit. Exam:  Constitutional:  The patient is awake, alert, and oriented x 3. No acute distress. Eyes:  pupils and irises appear normal Normal lids and conjunctivae ENMT:  grossly normal hearing  Lips appear normal external ears, nose appear normal Oropharynx: mucosa, tongue,posterior pharynx appear normal Neck:  neck appears normal, no masses, normal ROM, supple no thyromegaly Respiratory:  No increased work of breathing. No wheezes, rales, or rhonchi No tactile fremitus Cardiovascular:  Regular rate and  rhythm No murmurs, ectopy, or gallups. No lateral PMI. No thrills. Abdomen:  Abdomen is soft, non-tender, non-distended No hernias, masses, or organomegaly Normoactive bowel sounds.  Musculoskeletal:  No cyanosis, clubbing, or edema Skin:  No rashes, lesions, ulcers palpation of skin: no induration or nodules Neurologic:  CN 2-12 intact Sensation all 4 extremities intact Psychiatric:  Mental status Mood, affect appropriate Orientation to person, place, time  judgment and insight appear intact   Condition at discharge: fair  The results of significant diagnostics from this hospitalization (including imaging, microbiology, ancillary and laboratory) are listed below for reference.   Imaging Studies: CT ABDOMEN PELVIS W CONTRAST Result Date: 04/29/2024 CLINICAL DATA:  Sepsis EXAM: CT ABDOMEN AND PELVIS WITH CONTRAST TECHNIQUE: Multidetector CT imaging of the abdomen and pelvis was performed using the standard protocol following bolus administration of intravenous contrast. RADIATION DOSE REDUCTION: This exam was performed according to the departmental dose-optimization program which includes automated exposure control, adjustment of the mA and/or kV according to patient size and/or use of iterative reconstruction technique. CONTRAST:  OMNIPAQUE  IOHEXOL  300 MG/ML  SOLN COMPARISON:  CT abdomen and pelvis 07/06/2023 FINDINGS: Lower chest: No acute abnormality. Hepatobiliary: No focal liver abnormality is seen. No gallstones, gallbladder wall thickening, or biliary dilatation. Pancreas: Unremarkable. No pancreatic ductal dilatation or surrounding inflammatory changes. Spleen: Bladder is distended, but otherwise within normal limits. There is no hydronephrosis. There are rounded cortical hypodensities in both kidneys which are too small to characterize. The adrenal glands are within normal limits. Adrenals/Urinary Tract: Adrenal glands are unremarkable. Kidneys are normal, without renal  calculi, focal lesion, or hydronephrosis. Bladder is unremarkable. Stomach/Bowel: Stomach is within normal limits. Appendix appears normal. No evidence of bowel wall thickening, distention, or inflammatory changes. Vascular/Lymphatic: Aortic atherosclerosis. No enlarged abdominal or pelvic lymph nodes. Reproductive: Prostate is unremarkable. Other: There central mesenteric haziness with multiple small central mesenteric lymph nodes, unchanged from prior. There is no ascites. Musculoskeletal: No fracture is seen. IMPRESSION: 1. No acute localizing process in the abdomen or pelvis. 2. Stable central mesenteric haziness with multiple small central mesenteric lymph nodes. Findings are nonspecific and can be seen in the setting of mesenteric panniculitis. 3. Aortic atherosclerosis. Aortic Atherosclerosis (ICD10-I70.0). Electronically Signed   By: Greig Pique M.D.   On: 04/29/2024 17:55   CT Head Wo Contrast Result Date: 04/29/2024 EXAM: CT HEAD WITHOUT CONTRAST 04/29/2024 01:24:00 PM TECHNIQUE: CT of the head was performed without the administration of intravenous contrast. Automated exposure control, iterative reconstruction, and/or weight based adjustment of the mA/kV was utilized to reduce the radiation dose to as low as reasonably achievable. COMPARISON: CT head 07/09/2023 and MRI brain 07/06/2023. CLINICAL HISTORY: Mental status change, unknown cause. FINDINGS: BRAIN AND VENTRICLES: No acute intracranial hemorrhage. No mass effect or evidence of acute infarction. No hydrocephalus. No extra-axial collection. No midline shift. There is overall similar moderate-to-severe brain atrophy with ventricular enlargement, advanced for the patient's stated age. There is ex vacuo dilatation of the right occipital and temporal horns. Moderate-to-severe  microvascular ischemic changes of the cerebral white matter are present. Re-demonstrated regions of encephalomalacia involving the right occipital lobe, left frontal lobe,  and left parietal lobe. There is chronic evolution of encephalomalacia involving the right cerebellum, consistent with chronic infarction. ORBITS: No acute abnormality. SINUSES: No acute abnormality. SOFT TISSUES AND SKULL: No acute soft tissue abnormality. No skull fracture. IMPRESSION: 1. No acute intracranial hemorrhage, mass effect, or evidence of acute infarction. 2. Since 07/09/2023, chronic evolution of right cerebellar infarction. 3. Overall similar to moderate-to-severe brain atrophy, chronic microvascular ischemic changes of the cerebral white matter, and bilateral cerebral infarctions. Electronically signed by: prentice bybordi 04/29/2024 01:55 PM EST RP Workstation: [id number]   DG Chest Portable 1 View Result Date: 04/29/2024 EXAM: 1 VIEW(S) XRAY OF THE CHEST 04/29/2024 12:25:00 PM COMPARISON: 09/05/2023 CLINICAL HISTORY: weakness weakness weakness FINDINGS: LUNGS AND PLEURA: No focal pulmonary opacity. No pulmonary edema. No pleural effusion. No pneumothorax. HEART AND MEDIASTINUM: No acute abnormality of the cardiac and mediastinal silhouettes. BONES AND SOFT TISSUES: No acute osseous abnormality. IMPRESSION: 1. No acute cardiopulmonary disease. Electronically signed by: Lynwood Seip MD 04/29/2024 01:06 PM EST RP Workstation: [id number]    Microbiology: Results for orders placed or performed during the hospital encounter of 04/29/24  Culture, blood (routine x 2)     Status: None (Preliminary result)   Collection Time: 04/29/24 12:36 PM   Specimen: Right Antecubital; Blood  Result Value Ref Range Status   Specimen Description   Final    RIGHT ANTECUBITAL Performed at [facility], [address] [place], [place] [address]    Special Requests   Final    BOTTLES DRAWN AEROBIC AND ANAEROBIC Blood Culture adequate volume Performed at [facility], [address] [place], [place] [address]    Culture   Final    NO GROWTH 4 DAYS Performed at [facility],  [address] [place], [place] [address]    Report Status PENDING  Incomplete  Culture, blood (routine x 2)     Status: None (Preliminary result)   Collection Time: 04/29/24  1:40 PM   Specimen: BLOOD LEFT WRIST  Result Value Ref Range Status   Specimen Description   Final    BLOOD LEFT WRIST Performed at [facility], [address] [place], [place] [address]    Special Requests   Final    BOTTLES DRAWN AEROBIC AND ANAEROBIC Blood Culture results may not be optimal due to an inadequate volume of blood received in culture bottles Performed at [facility], [address] [place], [place] [address]    Culture   Final    NO GROWTH 4 DAYS Performed at [facility], [address] [place], [place] [address]    Report Status PENDING  Incomplete    Labs: CBC: Recent Labs  Lab 04/29/24 1238 04/30/24 0450  WBC 6.7 6.2  NEUTROABS 4.4  --   HGB 18.2* 14.7  HCT 54.0* 42.6  MCV 89.6 88.9  PLT 213 168   Basic Metabolic Panel: Recent Labs  Lab 04/29/24 1238 04/30/24 0450  NA 139 140  K 4.2 4.0  CL 98 105  CO2 27 28  GLUCOSE 43* 92  BUN 31* 18  CREATININE 1.31* 0.93  CALCIUM 10.8* 9.2   Liver Function Tests: Recent Labs  Lab 04/29/24 1238  AST 22  ALT 18  ALKPHOS 82  BILITOT 0.6  PROT 9.0*  ALBUMIN 5.1*  CBG: Recent Labs  Lab 04/29/24 1152 04/29/24 1326 04/29/24 1444 04/29/24 1617  GLUCAP 130* 73 135* 100*    Discharge time spent: greater than 30 minutes.  Signed: Vaniya Augspurger, DO [facility] 05/03/2024

## 2024-05-04 ENCOUNTER — Telehealth: Payer: Self-pay

## 2024-05-04 LAB — BLOOD CULTURE ID PANEL (REFLEXED) - BCID2

## 2024-05-04 LAB — CULTURE, BLOOD (ROUTINE X 2): Culture: NO GROWTH

## 2024-05-04 NOTE — Transitions of Care (Post Inpatient/ED Visit) (Signed)
   05/04/2024  Name: Taaj Hurlbut MRN: [id number] DOB: 03/23/1946  Today's TOC FU Call Status: Today's TOC FU Call Status:: Unsuccessful Call (1st Attempt) Unsuccessful Call (1st Attempt) Date: 05/04/24  Attempted to reach the patient regarding the most recent Inpatient/ED visit.  Follow Up Plan: Additional outreach attempts will be made to reach the patient to complete the Transitions of Care (Post Inpatient/ED visit) call.    Epimenio Schetter J. Chace Bisch RN, MSN [facility], [facility] RN Care Manager Direct Dial: [phone]  Fax: [phone] Website: [email or web address]

## 2024-05-05 ENCOUNTER — Telehealth: Payer: Self-pay

## 2024-05-05 LAB — CULTURE, BLOOD (ROUTINE X 2)
Culture  Setup Time: NO GROWTH
Report Status: NO GROWTH — AB
Special Requests: ADEQUATE

## 2024-05-05 NOTE — Transitions of Care (Post Inpatient/ED Visit) (Signed)
   05/05/2024  Name: [name] MRN: [id number] DOB: Apr 09, 1946  Today's TOC FU Call Status: Today's TOC FU Call Status:: Unsuccessful Call (2nd Attempt) Unsuccessful Call (2nd Attempt) Date: 05/05/24  Attempted to reach the patient regarding the most recent Inpatient/ED visit.  Follow Up Plan: Additional outreach attempts will be made to reach the patient to complete the Transitions of Care (Post Inpatient/ED visit) call.   Declan Adamson J. Liv Rallis RN, MSN [facility], [facility] RN Care Manager Direct Dial: [phone]  Fax: [phone] Website: [email or web address]

## 2024-05-06 ENCOUNTER — Telehealth: Payer: Self-pay

## 2024-05-06 NOTE — Transitions of Care (Post Inpatient/ED Visit) (Signed)
   05/06/2024  Name: Jake Page MRN: [id number] DOB: 1946-01-24  Today's TOC FU Call Status: Today's TOC FU Call Status:: Unsuccessful Call (3rd Attempt) Unsuccessful Call (3rd Attempt) Date: 05/06/24  Attempted to reach the patient regarding the most recent Inpatient/ED visit.  Follow Up Plan: No further outreach attempts will be made at this time. We have been unable to contact the patient.  Susumu Hackler J. Messina Kosinski RN, MSN [facility], [facility] RN Care Manager Direct Dial: [phone]  Fax: [phone] Website: [email or web address]

## 2024-05-20 NOTE — Telephone Encounter (Signed)
 Patient's daughter, Sharl, calling. States she received a voicemail in regards to patient's PET scan results. Please return her call at [phone]

## 2024-05-20 NOTE — Progress Notes (Signed)
 "          [facility]   Name: Jake Page Date of Birth: Feb 27, 1946 MRN: [id number]   Tumor Board Documentation:   Jake Page was presented at our Tumor Board on 05/20/2024, which included representatives from Medical Oncology, Radiation Oncology, Surgical Oncology, Pathology, Navigation, Research, Genetics, Radiology, Nutrition, and Tumor Registry.   Jake Page currently presents as current patient with history of the following treatments:    Additionally, we rev Final Diagnosis  Lymph node, left cervical, needle biopsy:  -Involvement by a B-cell lymphoproliferative disorder (see comment)                 Electronically signed by Suzen Sheldon, MD on 07/15/2023    Final Diagnosis    LYMPH NODE, CERVICAL; FLOW CYTOMETRIC ANALYSIS:   CD10-positive monotypic B-cell population identified. (See comment)   COMMENT:  A dim kappa light chain restricted B-cell population is identified, comprising about 8% of total lymphocytes. These cells express CD45, CD19, bright CD20, dim CD10 (subset), and CD38. They do not express CD5, or CD34. The presence of CD10 expression by this monotypic B-cell population suggests the differential diagnosis of follicular lymphoma, Burkitt lymphoma and diffuse large B-cell lymphoma. Correlation with concurrent morphology is necessary for final subclassification (see [id number]).    FLOW CYTOMETRY ANALYSIS: Monotypic B-cell population identified, see comment above. All tested markers were used for adequate analysis of the cells and were appropriately reviewed.    Electronically signed by Suzen Sheldon, MD on 07/11/2023   PET/CT Skull Base to Mid Thigh Status: Final result   Study Result  Narrative & Impression  PET/CT SKULL BASE TO MID THIGH, 05/18/2024 9:25 AM   INDICATION: lymphoma, Follicular lymphoma grade i, lymph nodes of head, face, and neck    (CMD) \ C82.01 Follicular lymphoma grade i, lymph nodes of head, face, and neck    (CMD) \  C85.10 Unspecified B-cell lymphoma, unspecified site    (CMD)  ADDITIONAL HISTORY: Low-grade B-cell lymphoma, currently on active surveillance. COMPARISON: PET/CT 11/07/2023   TECHNIQUE: 60 minutes after the intravenous injection of 11.3 mCi F-18 FDG, PET imaging was obtained from the skull base through the mid thighs. These images were then attenuation corrected using low-dose CT and co-registered for anatomic localization. Standardized uptake values (SUV) were calculated using a lean body mass algorithm. Blood glucose at the time of injection was 89 mg/dL.     All CT scans at [facility] are performed using radiation dose optimization techniques as appropriate to a performed exam, including but not limited to one or more of the following: automatic exposure control, adjustment of the mA and/or kV according to patient size, use of iterative reconstruction technique. In addition, our institution participates in a radiation dose monitoring program to optimize patient radiation exposure.   SUVmax of blood pool: 2.3 SUVmax of liver: 2.6      LIMITATIONS: None.   FINDINGS:    DISEASE-RELATED FINDINGS: *  Redemonstrated multiple FDG avid cervical chain lymph nodes bilaterally, some of which have increased in size and avidity. For example: *  Left level 2 cervical chain lymph node which currently measures up to 1.7 x 1.6 cm with an SUV max of 12 (fused image 25), previously 0.7 x 0.6 cm with an SUV max of 6.1. *  Similar to slight interval increase in avidity of a subcentimeter right level 2 II/parotid cervical chain lymph node with a current SUV max of 5.0,  previously 4.8 (fused image 22). *  Similar size and avidity of a subcentimeter left level 2/prone lymph node with a current SUV max of 9.3, previously 9.9 (fused image 20).   *  Interval increase in size and avidity of multiple central mesenteric lymph nodes, including a nodal conglomerate  measuring up to approximately 2.0 x 0.6 cm with a current SUV max of 3.5 (fused image 112), previously measuring approximately 0.8 x 0.6 cm SUVmax 2.7. *  Similar minimal FDG uptake associated with a subcentimeter right axillary lymph node with a current SUV max of 2.0 (fused image 60). *  Similar left periaortic 0.8 x 0.9 cm node with SUVmax 2.4, previously SUVmax 2.1   *  Normal size of the spleen with SUV max below the liver at 1.8.   OTHER TRACER-RELATED FINDINGS: *  Expected physiologic activity within the kidneys, ureter, bladder, oropharynx, salivary glands, stomach, bowel, and brain.  *  Uptake along the right greater than left hip joints, favored reactive/degenerative. *  Focal uptake along the rectosigmoid junction with an SUV max of 3 (fused image 134) *  Diffuse marrow uptake, likely Hologic.   ANCILLARY CT FINDINGS: *  Coronary artery calcification. Calcification along the thoracic aorta. *  Centrilobular emphysema. *  Similar regions of hypoattenuation/volume loss within the right occipital lobe and right cerebellum with no significant associated tracer uptake, favored sequela of remote infarction. *  Diffuse idiopathic skeletal hyperostosis. *  Cholelithiasis.     IMPRESSION: CONCLUSION: 1.  Deauville 5, Lugano classification of Progressive Disease (PD), as demonstrated by interval increase in size and avidity most notably of a left level II cervical node with similar to slightly increased size and intensity of other previously seen cervical and central mesenteric lymph nodes. 2.  Focal uptake along the rectosigmoid junction, which may be physiologic, however correlation with colonoscopy could be considered if not recently performed.   DEAUVILLE SCORE:   Score 1: No uptake above the background. Score 2: Uptake < mediastinum blood pool. Score 3: Uptake > mediastinum but < liver. Score 4: Uptake moderately increased compared to the liver at any site. Score 5: New tracer  avid disease related lesion, Uptake markedly increased compared to the liver at any site. Score X: New areas of uptake unlikely to be related to lymphoma.   LUGANO CLASSIFICATION:   Complete Response (CR): Deauville 1, 2, or 3 with absence of FDG-avid bone marrow disease. Partial Response (PR): Deauville 4 or 5 with decreased uptake compared to baseline and absence of structural disease progression on CT. Stable Disease (SD): Deauville 4 or 5 without significant change from baseline. Progressive Disease (PD): Deauville 4 or 5 with increasing intensity compared to baseline/interim examinations and/or any new FDG-avid focus consistent with lymphoma.     I have personally reviewed the procedure note and/or have reviewed and interpreted this image/images. Electronically Signed By: Juanice Rachael Shipper, MD on 05/18/2024 11:32 AM  Reviewed previous medical and familial history, history of present illness, and recent lab results along with all available histopathologic and imaging studies. The tumor board considered available treatment options and made the following recommendations: Biopsy   The following procedures/referrals are recommended:  Biopsy  Clinical Trial Status: Not discussed   National site-specific guidelines were discussed with respect to the case.   Tumor board is a meeting of clinicians from various specialty areas who evaluate and discuss patients for whom a multidisciplinary approach is being considered. Final determinations in the plan of care are those of  the provider(s). The responsibility for follow up of recommendations given during tumor board is that of the provider.    Today's extended care, comprehensive team conference did not exceed 30 minutes. Jake Page was not present for the discussion and was not examined.   "

## 2024-05-25 NOTE — Progress Notes (Signed)
" °   05/25/24 1055  Post - Proc Vital Signs  BP 143/67  MAP (mmHg) 89  Heart Rate 86  Resp 18  SpO2 99 %  Oxygen Therapy  O2 Device None (Room air)  Pulse Oximetry Type Continuous  Aldrete  Activity 2  Respiration 2  Circulation 2  Consciousness 1  Color 2  Aldrete Score 9  AVPU  Alertness Scale (AVPU) V  Pain Assessment  Pain Assessment 0-10  Pain Score   0  Baseline Nursing Assessment  Patient Position Supine  Skin Color Appropriate for ethnicity  Airway Natural  Respiratory Effort Regular;Spontaneous  Skin Condition/Temp Warm;Dry  L Hand Grasp Moderate  R Hand Grasp Moderate  Safety Wheels locked;Siderails Up  Care Handoff  Report Received From Randine, RN  Report via by phone  Report Given To Vina, RN  Report given to nurse    "

## 2024-06-01 NOTE — Telephone Encounter (Signed)
 Per Dr. Burnie message and verbal order the referral to head and neck surgeon ordered in system.

## 2024-07-02 NOTE — Progress Notes (Signed)
 Pt transported via stretcher by India, RN , and [place], CNA.

## 2024-07-02 NOTE — Progress Notes (Signed)
 Pt has been waiting for bed assignment/cleaning, pt has been cleared by anesthesia, and report called to receiving RN. Ana, RN took report and knows will be rolling when room is marked cleaned by EVS. Pt is stable, family at bedside, and continuing to monitor.

## 2024-07-03 NOTE — Discharge Summary (Signed)
 Otolaryngology Discharge Summary  Patient ID: Jake Page [id number] 79 y.o. January 21, 1946  Admit date: 07/02/2024 Admitting Physician: Isaiah Jenkins Libra, MD Admission Condition: good Admission Diagnoses:   Present on Admission:  Cervical lymphadenopathy  Neck mass   Discharge date and time: 07/03/2024 at approximately 12:29 PM Discharge Physician: No att. providers found  Discharged Condition: good Discharge Diagnoses:  Principal Problem:   Cervical lymphadenopathy Active Problems:   Neck mass Resolved Problems:   * No resolved hospital problems. *  Procedures/Surgeries performed during hospitalization:  EXCISIONAL BIOPSY LEFT NECK (Left: Neck)  Indication for Admission: Post-operative observation   Hospital Course: Jake Page was taken to the operating room on 07/02/2024 where he underwent the above mentioned procedure(s) without complication. He did well postoperatively and was monitored in a regular floor bed after being observed for a short while in PACU and deemed stable for transfer.  He remained afebrile and hemodynamically stable throughout hospitalization. He was transitioned to a regular diet which was well tolerated without nausea or vomiting.  Pain control was achieved on PO pain medications. He continued to have a normal UOP with normal vital signs for this patient. He was alert and oriented throughout hospitalization.   At the time of discharge, the patient was afebrile, in no acute distress and vital signs were stable. New discharge medications were discussed in detail and the patient stated understanding of use and administration. The patient verbalized understanding of all discharge instructions and therefore was released.  Consults: Treatment Team:  Surgeon: Isaiah Jenkins Libra, MD  Significant Diagnostic Studies:   As above  Discharge Exam: AAO, NAD Normal work of breathing  Extremities warm, well perfused.  Abdomen soft, non-distended   Incision(s) cdi JPx1 holding bulb suction w/ ss output Moving all extremities equally, no gross deficits.  Disposition: home  Discharge Medications and Instructions:  Discharge Medications:   Medication List     START taking these medications    acetaminophen  500 mg tablet Commonly known as: TYLENOL  Take 2 tablets (1,000 mg total) by mouth every 6 (six) hours for 10 days.   bacitracin 500 unit/gram ointment packet Apply 1 Application topically 2 (two) times a day for 5 days.       CHANGE how you take these medications    QUEtiapine 25 mg tablet Commonly known as: SEROquel Take a HALF tablet by mouth at bedtime (Take 0.5 tablets (12.5 mg total) by mouth at bedtime.) What changed:  when to take this reasons to take this       CONTINUE taking these medications    amLODIPine 2.5 mg tablet Commonly known as: NORVASC Take 2.5 mg by mouth daily.   donepeziL  5 mg tablet Commonly known as: ARICEPT  TAKE 1 TABLET BY MOUTH EVERY DAY   olmesartan-hydroCHLOROthiazide 20-12.5 mg Tab tablet Take 1 tablet by mouth daily. for 90 days   rosuvastatin  20 mg tablet Commonly known as: CRESTOR  Take 1 tablet by mouth daily.         Where to Get Your Medications     These medications were sent to [id number] - [place], [place] - [address] AT [address]OSS [address] - PHONE: [phone] - FAX: [phone]  [address], [place] [address]    Phone: [phone]  acetaminophen  500 mg tablet    Information about where to get these medications is not yet available   Ask your nurse or doctor about these medications bacitracin 500 unit/gram ointment packet    See AVS for  patient instructions.   Scheduled Future Appointments       Provider Department Dept Phone Center   07/07/2024 1:00 PM [name] ENT MPM OTO NURSE [facility] - MPM ENT [phone] [facility] MP [place]   07/16/2024 10:00 AM [facility] HEM ONC HP PERIPHERAL LAB [facility] [phone] Hematology Oncology [phone] [facility] [place]   07/16/2024 10:30 AM Vallathucherry JAYSON Clamp [facility] [phone] Hematology Oncology [phone] [facility] [place]   09/17/2024 1:00 PM Amy Cy Pellant [facility]  - Neurology [place] [phone] [facility] [address]       The patient was instructed to call if he develops a fever greater than 101.5, any drainage from the incision (when applicable), redness extending from his incision, unable to void, or any other questions or concerns.    Electronically signed by: Asberry Gaines Ferrari, PA-C 07/03/2024 12:29 PM

## 2024-07-03 NOTE — Progress Notes (Signed)
 Case Management Screening  CSN: [id number] DOB: July 08, 1945 Service: Ear/Nose/Throat Location: C934/A   Initial Screening Readmission Risk Score v2: 8.2 Risk Level: Low - Patient does not meet high risk criteria for post hospital services. Social Investment Banker, Operational will be available to assist as indicated.  Per chart review and PCC patient is a 79 year old male diagnosis: Cervical lymphadenopathy (Cervical lymphadenopathy [R59.0]), procedure: EXCISIONAL BIOPSY LEFT NECK (Left: Neck). Discharge home today with no social work needs.     Landry Sorrel, MSW

## 2024-07-03 NOTE — Telephone Encounter (Signed)
 Incoming VM on nurse triage line. Daughter call to request afternoon appointment for nurse visit next week. Scheduled 07/07/2024 needing after 12:00pm. Please assist

## 2024-07-03 NOTE — Progress Notes (Signed)
" ° ° °  Division of Pharmacy Services  Medication History Completion Note  Name/DOB/Age of Patient: Jake Page / 09-Nov-1945 / 79 y.o.  Location: [facility] 09 CC  Type: Presurgical & Modality: Phone  Confirmed two patient identifiers: Yes  Confirmed patient is alert & oriented: Yes  Medication History Source (Med History Informants):  Patient   Asked about any missing medications (such as pumps, injectable meds, TPN, OTC, etc): Yes  PTA Med List:  Prior to Admission Medications     Reviewed by Heather Collet, CPhT on 07/03/24 at 0650    Medication Sig Last Dose Informant Taking? Status  amLODIPine (NORVASC) 2.5 mg tablet Take 2.5 mg by mouth daily. 07/01/2024 Daughter, Child, Other Yes Active         Med Note BELINDA, ELISE C   Mon Jun 29, 2024  8:26 AM)    donepeziL  (ARICEPT ) 5 mg tablet TAKE 1 TABLET BY MOUTH EVERY DAY 07/01/2024 Daughter, Child, Other Yes Active  olmesartan-hydroCHLOROthiazide 20-12.5 mg tab tablet Take 1 tablet by mouth daily. for 90 days 07/01/2024 Child, Other Yes Active  QUEtiapine (SEROquel) 25 mg tablet Take 0.5 tablets (12.5 mg total) by mouth at bedtime.  Patient taking differently: Take 12.5 mg by mouth nightly as needed (agitation).   Past Month Child, Other Yes Expired 07/02/24 2359  rosuvastatin  (CRESTOR ) 20 mg tablet Take 1 tablet by mouth daily. 07/01/2024 Child, Other Yes Active            Selected Pharmacy:  [id number] - [place], [place] - [address] - PHONE: [phone] - FAX: [phone]  Comments: Medications and allergy information confirmed over the phone with pre-op technician within the last 30 days.  Comments:  All medications and allergies were verified in the Pre-Op Assessment clinic by the patient's daughter (English-speaking) who is a good historian, and last fills/doses were verified by Dr. Annemarie.   Reviewed by Certified Medication List Pharmacy Technician Electronically signed  by: Kelly Dorothyann Dolly, CPhT 06/29/2024 8:29 AM   Anticipated Procedure Date: 07/02/2024   DPS enrolled for delivery. Please call [phone] with questions for patients bedded in [facility] and [phone] for all other locations.    Medications reconciled by provider: No   Signature/Co-signature, if required: Heather Collet, CPhT   Date/Time: 07/03/2024 6:50 AM  "

## 2024-07-03 NOTE — Group Note (Signed)
 Inpatient Care Coordination Team Conference Note  07/03/2024   Time:10:32 AM   CSN: [id number]  DOB: [birth year]   Room/Bed: C934/A LOS: 1 Payor Info: Payor: UHC MEDICARE ADV / Plan: UHC MA DUAL COMPLETE RPPO-SNP / Product Type: Medicare Advantage /    Admitting Diagnosis: Cervical lymphadenopathy [R59.0] Neck mass [R22.1]  Admit Date/Time: 07/02/2024 11:43 AM Admission Comments: No comment available   Primary Diagnosis: Cervical lymphadenopathy Principal Problem: Cervical lymphadenopathy  Predictive Model Details        8.2% (Low)  Factor Value   Calculated 07/03/2024 08:06 13% Number of ED visits in last 90 days 0   Readmission Risk Score v2 Model 9% Admission type not urgent    7% Braden score 20    6% Number of active outpatient medication orders 5    6% Latest RDW in last 72 hrs 13 %     Team Members Present: Case Manager, Advanced Practice Provider, Nurse, Nutritionist, Social Worker  Expected Discharge Date: Jul 03, 2024  Landry Sorrel, MSW

## 2024-07-06 ENCOUNTER — Telehealth: Payer: Self-pay

## 2024-07-06 NOTE — Transitions of Care (Post Inpatient/ED Visit) (Signed)
" ° °  07/06/2024  Name: Brodyn Depuy MRN: [id number] DOB: 01/19/1946  Today's TOC FU Call Status: Today's TOC FU Call Status:: Unsuccessful Call (1st Attempt) Unsuccessful Call (1st Attempt) Date: 07/06/24  Attempted to reach the patient regarding the most recent Inpatient/ED visit.  Follow Up Plan: Additional outreach attempts will be made to reach the patient to complete the Transitions of Care (Post Inpatient/ED visit) call.   Deegan Valentino J. Taleigha Pinson RN, MSN [facility], [facility] RN Care Manager Direct Dial: [phone]  Fax: [phone] Website: [email or web address]   "

## 2024-07-06 NOTE — Transitions of Care (Post Inpatient/ED Visit) (Signed)
" ° °  07/06/2024  Name: Jake Page MRN: [id number] DOB: 20-Jul-1945  Today's TOC FU Call Status: Today's TOC FU Call Status:: Successful TOC FU Call Completed Unsuccessful Call (2nd Attempt) Date: 07/06/24 [facility] FU Call Complete Date: 07/06/24  Patient's Name and Date of Birth confirmed. Name, DOB (per daughter Trang)  Transition Care Management Follow-up Telephone Call Date of Discharge: 05/01/24 Discharge Facility: Other [facility]) Name of Other (Non[facility]) Discharge Facility: [facility] Type of Discharge: Inpatient Admission Primary Inpatient Discharge Diagnosis:: Cervical lymphadenopathy How have you been since you were released from the hospital?: Better Any questions or concerns?: No  Items Reviewed: Did you receive and understand the discharge instructions provided?: Yes Medications obtained,verified, and reconciled?: No Medications Not Reviewed Reasons::  (daughter declined medication review) Any new allergies since your discharge?: No Dietary orders reviewed?: Yes Type of Diet Ordered:: Regular diet Do you have support at home?: Yes People in Home [RPT]: child(ren), adult Name of Support/Comfort Primary Source: Trang  Medications Reviewed Today: Medications Reviewed Today   Medications were not reviewed in this encounter     Home Care and Equipment/Supplies: Were Home Health Services Ordered?: NA Any new equipment or medical supplies ordered?: NA  Functional Questionnaire: Do you need assistance with bathing/showering or dressing?: No Do you need assistance with meal preparation?: No Do you need assistance with eating?: No Do you have difficulty maintaining continence: No Do you need assistance with getting out of bed/getting out of a chair/moving?: No Do you have difficulty managing or taking your medications?: No  Follow up appointments reviewed: PCP Follow-up appointment confirmed?: No (daughter to call) MD Provider Line  Number:[phone] Given: No Specialist Hospital Follow-up appointment confirmed?: Yes Date of Specialist follow-up appointment?: 07/07/24 Follow-Up Specialty Provider:: Otolaryngology Do you need transportation to your follow-up appointment?: No Do you understand care options if your condition(s) worsen?: Yes-patient verbalized understanding   Moana Munford DOROTHA Seeds RN, MSN [facility]  [facility], [facility] RN Care Manager Direct Dial: [phone]  Fax: [phone] Website: [email or web address]     "

## 2024-07-06 NOTE — Patient Instructions (Signed)
 Visit Information  Thank you for taking time to visit with me today. Please don't hesitate to contact me if I can be of assistance to you.    Call Your Doctor If Any of These Occur Redness, drainage, or swelling at the wound.  Temperature greater than 101 degrees. Severe pain not relieved by pain medication. Incision starts to come apart.  Patient verbalizes understanding of instructions and care plan provided today and agrees to view in MyChart. Active MyChart status and patient understanding of how to access instructions and care plan via MyChart confirmed with patient.     The patient has been provided with contact information for the care management team and has been advised to call with any health related questions or concerns.   Please call the care guide team at [phone] if you need to cancel or reschedule your appointment.   Please call the Suicide and Crisis Lifeline: 988 if you are experiencing a Mental Health or Behavioral Health Crisis or need someone to talk to.  Pinky Ravan J. Jamarie Joplin RN, MSN [facility], [facility] RN Care Manager Direct Dial: [phone]  Fax: [phone] Website: [email or web address]
